# Patient Record
Sex: Male | Born: 1960 | Race: Black or African American | Hispanic: No | Marital: Single | Smoking: Current some day smoker
Health system: Southern US, Community
[De-identification: ages and names within clinical notes are randomized; demographics above are authoritative.]

## PROBLEM LIST (undated history)

## (undated) DIAGNOSIS — Z973 Presence of spectacles and contact lenses: Secondary | ICD-10-CM

## (undated) DIAGNOSIS — G5600 Carpal tunnel syndrome, unspecified upper limb: Secondary | ICD-10-CM

## (undated) DIAGNOSIS — T7840XA Allergy, unspecified, initial encounter: Secondary | ICD-10-CM

## (undated) DIAGNOSIS — K319 Disease of stomach and duodenum, unspecified: Secondary | ICD-10-CM

## (undated) DIAGNOSIS — G4730 Sleep apnea, unspecified: Secondary | ICD-10-CM

## (undated) DIAGNOSIS — D649 Anemia, unspecified: Secondary | ICD-10-CM

## (undated) DIAGNOSIS — I10 Essential (primary) hypertension: Secondary | ICD-10-CM

## (undated) DIAGNOSIS — E119 Type 2 diabetes mellitus without complications: Secondary | ICD-10-CM

## (undated) DIAGNOSIS — F1420 Cocaine dependence, uncomplicated: Secondary | ICD-10-CM

## (undated) DIAGNOSIS — K602 Anal fissure, unspecified: Secondary | ICD-10-CM

## (undated) DIAGNOSIS — M1990 Unspecified osteoarthritis, unspecified site: Secondary | ICD-10-CM

## (undated) DIAGNOSIS — G971 Other reaction to spinal and lumbar puncture: Secondary | ICD-10-CM

## (undated) DIAGNOSIS — F1020 Alcohol dependence, uncomplicated: Secondary | ICD-10-CM

## (undated) DIAGNOSIS — I25118 Atherosclerotic heart disease of native coronary artery with other forms of angina pectoris: Secondary | ICD-10-CM

## (undated) DIAGNOSIS — K635 Polyp of colon: Secondary | ICD-10-CM

## (undated) DIAGNOSIS — Z21 Asymptomatic human immunodeficiency virus [HIV] infection status: Secondary | ICD-10-CM

## (undated) DIAGNOSIS — G629 Polyneuropathy, unspecified: Secondary | ICD-10-CM

## (undated) DIAGNOSIS — F32A Depression, unspecified: Secondary | ICD-10-CM

## (undated) DIAGNOSIS — N19 Unspecified kidney failure: Secondary | ICD-10-CM

## (undated) DIAGNOSIS — J309 Allergic rhinitis, unspecified: Secondary | ICD-10-CM

## (undated) DIAGNOSIS — B20 Human immunodeficiency virus [HIV] disease: Secondary | ICD-10-CM

## (undated) DIAGNOSIS — F0390 Unspecified dementia without behavioral disturbance: Secondary | ICD-10-CM

## (undated) DIAGNOSIS — K219 Gastro-esophageal reflux disease without esophagitis: Secondary | ICD-10-CM

## (undated) DIAGNOSIS — F329 Major depressive disorder, single episode, unspecified: Secondary | ICD-10-CM

## (undated) DIAGNOSIS — F1220 Cannabis dependence, uncomplicated: Secondary | ICD-10-CM

## (undated) DIAGNOSIS — T4145XA Adverse effect of unspecified anesthetic, initial encounter: Secondary | ICD-10-CM

## (undated) DIAGNOSIS — N183 Chronic kidney disease, stage 3 unspecified: Secondary | ICD-10-CM

## (undated) DIAGNOSIS — I2510 Atherosclerotic heart disease of native coronary artery without angina pectoris: Secondary | ICD-10-CM

## (undated) DIAGNOSIS — F4321 Adjustment disorder with depressed mood: Secondary | ICD-10-CM

## (undated) DIAGNOSIS — I219 Acute myocardial infarction, unspecified: Secondary | ICD-10-CM

## (undated) DIAGNOSIS — T8859XA Other complications of anesthesia, initial encounter: Secondary | ICD-10-CM

## (undated) DIAGNOSIS — R569 Unspecified convulsions: Secondary | ICD-10-CM

## (undated) HISTORY — DX: Depression, unspecified: F32.A

## (undated) HISTORY — DX: Unspecified dementia, unspecified severity, without behavioral disturbance, psychotic disturbance, mood disturbance, and anxiety: F03.90

## (undated) HISTORY — DX: Alcohol dependence, uncomplicated: F10.20

## (undated) HISTORY — DX: Chronic kidney disease, stage 3 unspecified: N18.30

## (undated) HISTORY — DX: Unspecified osteoarthritis, unspecified site: M19.90

## (undated) HISTORY — DX: Type 2 diabetes mellitus without complications: E11.9

## (undated) HISTORY — DX: Polyp of colon: K63.5

## (undated) HISTORY — DX: Allergic rhinitis, unspecified: J30.9

## (undated) HISTORY — DX: Other complications of anesthesia, initial encounter: T88.59XA

## (undated) HISTORY — DX: Anal fissure, unspecified: K60.2

## (undated) HISTORY — DX: Asymptomatic human immunodeficiency virus (hiv) infection status: Z21

## (undated) HISTORY — PX: COLONOSCOPY: SHX174

## (undated) HISTORY — DX: Atherosclerotic heart disease of native coronary artery with other forms of angina pectoris: I25.118

## (undated) HISTORY — DX: Atherosclerotic heart disease of native coronary artery without angina pectoris: I25.10

## (undated) HISTORY — DX: Unspecified kidney failure: N19

## (undated) HISTORY — PX: CORONARY STENT PLACEMENT: SHX1402

## (undated) HISTORY — DX: Unspecified convulsions: R56.9

## (undated) HISTORY — PX: CARDIAC CATHETERIZATION: SHX172

## (undated) HISTORY — DX: Acute myocardial infarction, unspecified: I21.9

## (undated) HISTORY — PX: ESOPHAGOGASTRODUODENOSCOPY: SHX1529

## (undated) HISTORY — DX: Adjustment disorder with depressed mood: F43.21

## (undated) HISTORY — DX: Carpal tunnel syndrome, unspecified upper limb: G56.00

## (undated) HISTORY — DX: Sleep apnea, unspecified: G47.30

## (undated) HISTORY — DX: Gastro-esophageal reflux disease without esophagitis: K21.9

## (undated) HISTORY — DX: Cannabis dependence, uncomplicated: F12.20

## (undated) HISTORY — DX: Human immunodeficiency virus (HIV) disease: B20

## (undated) HISTORY — DX: Anemia, unspecified: D64.9

## (undated) HISTORY — DX: Essential (primary) hypertension: I10

## (undated) HISTORY — DX: Cocaine dependence, uncomplicated: F14.20

---
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
## 2010-01-01 ENCOUNTER — Inpatient Hospital Stay (HOSPITAL_COMMUNITY): Admission: AC | Admit: 2010-01-01 | Discharge: 2010-01-05 | Payer: Self-pay | Admitting: Emergency Medicine

## 2010-07-11 ENCOUNTER — Emergency Department (HOSPITAL_COMMUNITY)
Admission: EM | Admit: 2010-07-11 | Discharge: 2010-07-12 | Disposition: A | Discharge status: Home / Self Care | Attending: Emergency Medicine | Admitting: Emergency Medicine

## 2010-07-11 DIAGNOSIS — Z9861 Coronary angioplasty status: Secondary | ICD-10-CM | POA: Insufficient documentation

## 2010-07-11 DIAGNOSIS — N289 Disorder of kidney and ureter, unspecified: Secondary | ICD-10-CM | POA: Insufficient documentation

## 2010-07-11 DIAGNOSIS — R5383 Other fatigue: Secondary | ICD-10-CM | POA: Insufficient documentation

## 2010-07-11 DIAGNOSIS — G40909 Epilepsy, unspecified, not intractable, without status epilepticus: Secondary | ICD-10-CM | POA: Insufficient documentation

## 2010-07-11 DIAGNOSIS — E119 Type 2 diabetes mellitus without complications: Secondary | ICD-10-CM | POA: Insufficient documentation

## 2010-07-11 DIAGNOSIS — I2510 Atherosclerotic heart disease of native coronary artery without angina pectoris: Secondary | ICD-10-CM | POA: Insufficient documentation

## 2010-07-11 DIAGNOSIS — H538 Other visual disturbances: Secondary | ICD-10-CM | POA: Insufficient documentation

## 2010-07-11 DIAGNOSIS — Z79899 Other long term (current) drug therapy: Secondary | ICD-10-CM | POA: Insufficient documentation

## 2010-07-11 DIAGNOSIS — R5381 Other malaise: Secondary | ICD-10-CM | POA: Insufficient documentation

## 2010-07-11 DIAGNOSIS — I10 Essential (primary) hypertension: Secondary | ICD-10-CM | POA: Insufficient documentation

## 2010-07-11 DIAGNOSIS — R631 Polydipsia: Secondary | ICD-10-CM | POA: Insufficient documentation

## 2010-07-11 DIAGNOSIS — Z21 Asymptomatic human immunodeficiency virus [HIV] infection status: Secondary | ICD-10-CM | POA: Insufficient documentation

## 2010-07-11 LAB — GLUCOSE, CAPILLARY
Glucose-Capillary: 555 mg/dL (ref 70–99)
Glucose-Capillary: 344 mg/dL — ABNORMAL HIGH (ref 70–99)

## 2010-07-11 LAB — URINALYSIS, ROUTINE W REFLEX MICROSCOPIC
Glucose, UA: >1000 mg/dL — AB
Protein, ur: NEGATIVE mg/dL
pH: 6 (ref 5.0–8.0)

## 2010-07-11 LAB — POCT I-STAT, CHEM 8
Calcium, Ion: 1 mmol/L — ABNORMAL LOW (ref 1.12–1.32)
Chloride: 103 mEq/L (ref 96–112)
Glucose, Bld: 450 mg/dL — ABNORMAL HIGH (ref 70–99)
HCT: 39 % (ref 39.0–52.0)
TCO2: 15 mmol/L (ref 0–100)

## 2010-07-11 LAB — POCT I-STAT 3, VENOUS BLOOD GAS (G3P V)
Bicarbonate: 22.3 mEq/L (ref 20.0–24.0)
Patient temperature: 97.9
TCO2: 23 mmol/L (ref 0–100)
pH, Ven: 7.379 — ABNORMAL HIGH (ref 7.250–7.300)
pO2, Ven: 34 mmHg (ref 30.0–45.0)

## 2010-07-11 LAB — CBC
Hemoglobin: 13.2 g/dL (ref 13.0–17.0)
MCH: 32.8 pg (ref 26.0–34.0)
MCV: 88.8 fL (ref 78.0–100.0)
RBC: 4.03 MIL/uL — ABNORMAL LOW (ref 4.22–5.81)
WBC: 6.8 10*3/uL (ref 4.0–10.5)

## 2010-07-11 LAB — URINE MICROSCOPIC-ADD ON

## 2010-07-12 LAB — BASIC METABOLIC PANEL
BUN: 8 mg/dL (ref 6–23)
CO2: 23 mEq/L (ref 19–32)
Chloride: 108 mEq/L (ref 96–112)
Glucose, Bld: 77 mg/dL (ref 70–99)
Potassium: 3.1 mEq/L — ABNORMAL LOW (ref 3.5–5.1)
Sodium: 140 mEq/L (ref 135–145)

## 2010-07-13 ENCOUNTER — Observation Stay (HOSPITAL_COMMUNITY)
Admission: EM | Admit: 2010-07-13 | Discharge: 2010-07-14 | Disposition: A | Discharge status: Home / Self Care | Attending: Internal Medicine | Admitting: Internal Medicine

## 2010-07-13 DIAGNOSIS — I10 Essential (primary) hypertension: Secondary | ICD-10-CM | POA: Insufficient documentation

## 2010-07-13 DIAGNOSIS — F17200 Nicotine dependence, unspecified, uncomplicated: Secondary | ICD-10-CM | POA: Insufficient documentation

## 2010-07-13 DIAGNOSIS — G40802 Other epilepsy, not intractable, without status epilepticus: Secondary | ICD-10-CM | POA: Insufficient documentation

## 2010-07-13 DIAGNOSIS — R079 Chest pain, unspecified: Secondary | ICD-10-CM | POA: Insufficient documentation

## 2010-07-13 DIAGNOSIS — E119 Type 2 diabetes mellitus without complications: Principal | ICD-10-CM | POA: Insufficient documentation

## 2010-07-13 DIAGNOSIS — Z21 Asymptomatic human immunodeficiency virus [HIV] infection status: Secondary | ICD-10-CM | POA: Insufficient documentation

## 2010-07-13 DIAGNOSIS — IMO0001 Reserved for inherently not codable concepts without codable children: Secondary | ICD-10-CM | POA: Insufficient documentation

## 2010-07-13 DIAGNOSIS — F1210 Cannabis abuse, uncomplicated: Secondary | ICD-10-CM | POA: Insufficient documentation

## 2010-07-13 LAB — CBC
HCT: 35.3 % — ABNORMAL LOW (ref 39.0–52.0)
HCT: 33.2 % — ABNORMAL LOW (ref 39.0–52.0)
Hemoglobin: 12.8 g/dL — ABNORMAL LOW (ref 13.0–17.0)
Hemoglobin: 11.4 g/dL — ABNORMAL LOW (ref 13.0–17.0)
Hemoglobin: 12.1 g/dL — ABNORMAL LOW (ref 13.0–17.0)
MCH: 33.2 pg (ref 26.0–34.0)
MCH: 32.3 pg (ref 26.0–34.0)
MCH: 32.5 pg (ref 26.0–34.0)
MCHC: 36.3 g/dL — ABNORMAL HIGH (ref 30.0–36.0)
MCHC: 34.6 g/dL (ref 30.0–36.0)
MCV: 91.7 fL (ref 78.0–100.0)
MCV: 94.6 fL (ref 78.0–100.0)
Platelets: 217 10*3/uL (ref 150–400)
Platelets: 172 10*3/uL (ref 150–400)
Platelets: 180 10*3/uL (ref 150–400)
RBC: 3.85 MIL/uL — ABNORMAL LOW (ref 4.22–5.81)
RBC: 3.51 MIL/uL — ABNORMAL LOW (ref 4.22–5.81)
RBC: 3.71 MIL/uL — ABNORMAL LOW (ref 4.22–5.81)
RDW: 13.1 % (ref 11.5–15.5)
RDW: 13.7 % (ref 11.5–15.5)
WBC: 6.1 10*3/uL (ref 4.0–10.5)
WBC: 7 10*3/uL (ref 4.0–10.5)
WBC: 8.1 10*3/uL (ref 4.0–10.5)

## 2010-07-13 LAB — COMPREHENSIVE METABOLIC PANEL
AST: 57 U/L — ABNORMAL HIGH (ref 0–37)
CO2: 20 mEq/L (ref 19–32)
Calcium: 8.9 mg/dL (ref 8.4–10.5)
Creatinine, Ser: 1.75 mg/dL — ABNORMAL HIGH (ref 0.4–1.5)
GFR calc Af Amer: 51 mL/min — ABNORMAL LOW (ref >60)
GFR calc non Af Amer: 42 mL/min — ABNORMAL LOW (ref >60)
Glucose, Bld: 156 mg/dL — ABNORMAL HIGH (ref 70–99)

## 2010-07-13 LAB — URINALYSIS, ROUTINE W REFLEX MICROSCOPIC
Bilirubin Urine: NEGATIVE
Bilirubin Urine: NEGATIVE
Glucose, UA: >1000 mg/dL — AB
Glucose, UA: 250 mg/dL — AB
Hgb urine dipstick: NEGATIVE
Ketones, ur: 15 mg/dL — AB
Ketones, ur: NEGATIVE mg/dL
Leukocytes, UA: NEGATIVE
Leukocytes, UA: NEGATIVE
Nitrite: NEGATIVE
Protein, ur: NEGATIVE mg/dL
Specific Gravity, Urine: 1.038 — ABNORMAL HIGH (ref 1.005–1.030)
Urobilinogen, UA: 0.2 mg/dL (ref 0.0–1.0)
pH: 6 (ref 5.0–8.0)
pH: 6 (ref 5.0–8.0)

## 2010-07-13 LAB — POCT I-STAT, CHEM 8
BUN: 19 mg/dL (ref 6–23)
Calcium, Ion: 1.16 mmol/L (ref 1.12–1.32)
Chloride: 112 mEq/L (ref 96–112)
Creatinine, Ser: 1.9 mg/dL — ABNORMAL HIGH (ref 0.4–1.5)
Glucose, Bld: 151 mg/dL — ABNORMAL HIGH (ref 70–99)

## 2010-07-13 LAB — GLUCOSE, CAPILLARY
Glucose-Capillary: 100 mg/dL — ABNORMAL HIGH (ref 70–99)
Glucose-Capillary: 109 mg/dL — ABNORMAL HIGH (ref 70–99)
Glucose-Capillary: 122 mg/dL — ABNORMAL HIGH (ref 70–99)
Glucose-Capillary: 122 mg/dL — ABNORMAL HIGH (ref 70–99)
Glucose-Capillary: 124 mg/dL — ABNORMAL HIGH (ref 70–99)
Glucose-Capillary: 139 mg/dL — ABNORMAL HIGH (ref 70–99)
Glucose-Capillary: 157 mg/dL — ABNORMAL HIGH (ref 70–99)
Glucose-Capillary: 200 mg/dL — ABNORMAL HIGH (ref 70–99)
Glucose-Capillary: 89 mg/dL (ref 70–99)

## 2010-07-13 LAB — PROTIME-INR
INR: 1.02 (ref 0.00–1.49)
Prothrombin Time: 13.6 seconds (ref 11.6–15.2)

## 2010-07-13 LAB — TYPE AND SCREEN: ABO/RH(D): O POS

## 2010-07-13 LAB — BLOOD GAS, ARTERIAL
Acid-base deficit: 5.2 mmol/L — ABNORMAL HIGH (ref 0.0–2.0)
Bicarbonate: 19.2 mEq/L — ABNORMAL LOW (ref 20.0–24.0)
FIO2: 0.6 %
MECHVT: 600 mL
TCO2: 20.3 mmol/L (ref 0–100)
pCO2 arterial: 34.6 mmHg — ABNORMAL LOW (ref 35.0–45.0)

## 2010-07-13 LAB — DIFFERENTIAL
Basophils Absolute: 0 10*3/uL (ref 0.0–0.1)
Basophils Relative: 0 % (ref 0–1)
Eosinophils Absolute: 0.1 10*3/uL (ref 0.0–0.7)
Eosinophils Relative: 2 % (ref 0–5)
Lymphocytes Relative: 39 % (ref 12–46)
Lymphs Abs: 2.4 10*3/uL (ref 0.7–4.0)
Monocytes Absolute: 0.4 10*3/uL (ref 0.1–1.0)
Monocytes Relative: 6 % (ref 3–12)
Neutro Abs: 3.2 10*3/uL (ref 1.7–7.7)
Neutrophils Relative %: 53 % (ref 43–77)

## 2010-07-13 LAB — BASIC METABOLIC PANEL
BUN: 19 mg/dL (ref 6–23)
BUN: 7 mg/dL (ref 6–23)
BUN: 9 mg/dL (ref 6–23)
CO2: 23 mEq/L (ref 19–32)
CO2: 23 mEq/L (ref 19–32)
Calcium: 8.5 mg/dL (ref 8.4–10.5)
Calcium: 8.8 mg/dL (ref 8.4–10.5)
Chloride: 116 mEq/L — ABNORMAL HIGH (ref 96–112)
Chloride: 98 mEq/L (ref 96–112)
Creatinine, Ser: 1.39 mg/dL (ref 0.4–1.5)
Creatinine, Ser: 1.5 mg/dL (ref 0.4–1.5)
Creatinine, Ser: 1.89 mg/dL — ABNORMAL HIGH (ref 0.4–1.5)
GFR calc Af Amer: 46 mL/min — ABNORMAL LOW (ref >60)
GFR calc Af Amer: 60 mL/min — ABNORMAL LOW (ref >60)
GFR calc Af Amer: >60 mL/min (ref >60)
GFR calc Af Amer: >60 mL/min (ref >60)
GFR calc non Af Amer: 38 mL/min — ABNORMAL LOW (ref >60)
GFR calc non Af Amer: 50 mL/min — ABNORMAL LOW (ref >60)
GFR calc non Af Amer: 55 mL/min — ABNORMAL LOW (ref >60)
Glucose, Bld: 591 mg/dL (ref 70–99)
Potassium: 3 mEq/L — ABNORMAL LOW (ref 3.5–5.1)
Potassium: 3.3 mEq/L — ABNORMAL LOW (ref 3.5–5.1)
Potassium: 4.9 mEq/L (ref 3.5–5.1)
Sodium: 128 mEq/L — ABNORMAL LOW (ref 135–145)
Sodium: 139 mEq/L (ref 135–145)

## 2010-07-13 LAB — POCT I-STAT 3, VENOUS BLOOD GAS (G3P V)
Acid-base deficit: 1 mmol/L (ref 0.0–2.0)
Bicarbonate: 23.7 mEq/L (ref 20.0–24.0)
O2 Saturation: 59 %
pCO2, Ven: 39 mmHg — ABNORMAL LOW (ref 45.0–50.0)
pO2, Ven: 31 mmHg (ref 30.0–45.0)

## 2010-07-13 LAB — URINE MICROSCOPIC-ADD ON

## 2010-07-13 LAB — APTT: aPTT: 20 seconds — ABNORMAL LOW (ref 24–37)

## 2010-07-13 LAB — MRSA PCR SCREENING: MRSA by PCR: NEGATIVE

## 2010-07-14 LAB — CBC
MCH: 32.5 pg (ref 26.0–34.0)
MCV: 90.2 fL (ref 78.0–100.0)
Platelets: 229 10*3/uL (ref 150–400)
RDW: 12.9 % (ref 11.5–15.5)
WBC: 6.2 10*3/uL (ref 4.0–10.5)

## 2010-07-14 LAB — GLUCOSE, CAPILLARY
Glucose-Capillary: 117 mg/dL — ABNORMAL HIGH (ref 70–99)
Glucose-Capillary: 99 mg/dL (ref 70–99)

## 2010-07-14 LAB — BASIC METABOLIC PANEL
BUN: 13 mg/dL (ref 6–23)
Calcium: 8.5 mg/dL (ref 8.4–10.5)
Creatinine, Ser: 1.3 mg/dL (ref 0.4–1.5)
GFR calc non Af Amer: 59 mL/min — ABNORMAL LOW (ref >60)
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
## 2010-07-27 NOTE — Discharge Summary (Signed)
  NAMEPANFILO, Harold Davenport NO.:  [id number]  MEDICAL RECORD NO.:  [id number]           PATIENT TYPE:  O  LOCATION:  5505                         FACILITY:  [facility]  PHYSICIAN:  Zannie Cove, MD     DATE OF BIRTH:  [birth year]  DATE OF ADMISSION:  07/13/2010 DATE OF DISCHARGE:  07/14/2010                              DISCHARGE SUMMARY   PRIMARY CARE PHYSICIAN:  [place], [place].  DISCHARGE DIAGNOSES: 1. Uncontrolled hyperglycemia. 2. HIV on highly active antiretroviral therapy. 3. Seizure disorder. 4. Tobacco use. 5. Mild dehydration. 6. Reported history of coronary artery disease and chronic kidney     disease of unclear severity and type.  DISCHARGE MEDICATIONS: 1. Lantus 20 units subcu at bedtime. 2. abacavir lamivudine 600/300 one tab daily. 3. Amlodipine 10 mg daily. 4. Atazanavir 300 mg daily. 5. Cetirizine 10 mg daily. 6. Chlorhexidine oral rinse 15 mL p.o. q.h.s. 7. Clonidine 0.1 mg p.o. daily. 8. Diphenhydramine 25 mg p.o. daily p.r.n. 9. Glipizide 10 mg p.o. b.i.d. 10.Hydroxyzine pamoate 25 mg p.o. b.i.d. p.r.n. 11.Imdur 30 mg p.o. daily. 12.Lisinopril 40 mg daily. 13.Keppra 750 mg p.o. b.i.d. 14.Paroxetine 40 mg 1/2 tab p.o. bedtime. 15.Ritonavir 100 mg capsule daily. 16.Trazodone 150 mg 2 tablets p.o. q.h.s. p.r.n.  HOSPITAL COURSE:  Mr. Sivils is a 50 year old gentleman with type 2 diabetes for 19 years, presented to the ER originally on July 11, 2010, with very high blood sugars running in the 450s.  He was placed on hypoglycemia protocol, the CT, and then discharged home.  Subsequently, he went home on glipizide and tried to increase his dose, but then kept having persistent high sugars in the 500 and 600 range and hence came back to the hospital, originally started here. 1. For his uncontrolled hyperglycemia, he did not have any evidence of     DKA and his mentation remained appropriate and normal.  He was     started on a glucose  stabilizer initially, subsequently     transitioned to subcu Lantus.  He also had diabetic education in     the hospital and is being discharged home on Lantus to follow up at     the [place] in 3 days.  He has an appointment on Monday. 2. HIV.  He is continued on HAART therapy through his hospital course. 3. Seizure disorder, continued on Keppra.  DISCHARGE CONDITION:  Stable.  CONDITION ON DISCHARGE:  Temp is 98.1, pulse 76, blood pressure 107/71, respirations 18, satting 98% on room air.  DISCHARGE FOLLOWUP:  With primary physician at [place], [place] on Monday July 17, 2010.     Zannie Cove, MD     PJ/MEDQ  D:  07/14/2010  T:  07/15/2010  Job:  [id number]  cc:   Vilinda [place], [place]  Electronically Signed by Zannie Cove  on 07/27/2010 05:27:07 PM

## 2010-09-24 ENCOUNTER — Emergency Department (HOSPITAL_COMMUNITY)
Admission: EM | Admit: 2010-09-24 | Discharge: 2010-09-25 | Disposition: A | Discharge status: Home / Self Care | Attending: Emergency Medicine | Admitting: Emergency Medicine

## 2010-09-24 DIAGNOSIS — Z21 Asymptomatic human immunodeficiency virus [HIV] infection status: Secondary | ICD-10-CM | POA: Insufficient documentation

## 2010-09-24 DIAGNOSIS — I2510 Atherosclerotic heart disease of native coronary artery without angina pectoris: Secondary | ICD-10-CM | POA: Insufficient documentation

## 2010-09-24 DIAGNOSIS — Z9861 Coronary angioplasty status: Secondary | ICD-10-CM | POA: Insufficient documentation

## 2010-09-24 DIAGNOSIS — F411 Generalized anxiety disorder: Secondary | ICD-10-CM | POA: Insufficient documentation

## 2010-09-24 DIAGNOSIS — L299 Pruritus, unspecified: Secondary | ICD-10-CM | POA: Insufficient documentation

## 2010-09-24 DIAGNOSIS — J309 Allergic rhinitis, unspecified: Secondary | ICD-10-CM | POA: Insufficient documentation

## 2010-09-24 DIAGNOSIS — J3489 Other specified disorders of nose and nasal sinuses: Secondary | ICD-10-CM | POA: Insufficient documentation

## 2010-09-24 DIAGNOSIS — E119 Type 2 diabetes mellitus without complications: Secondary | ICD-10-CM | POA: Insufficient documentation

## 2010-09-24 DIAGNOSIS — I10 Essential (primary) hypertension: Secondary | ICD-10-CM | POA: Insufficient documentation

## 2010-09-24 DIAGNOSIS — G40909 Epilepsy, unspecified, not intractable, without status epilepticus: Secondary | ICD-10-CM | POA: Insufficient documentation

## 2018-10-24 ENCOUNTER — Telehealth: Payer: Self-pay

## 2018-10-24 ENCOUNTER — Ambulatory Visit (INDEPENDENT_AMBULATORY_CARE_PROVIDER_SITE_OTHER): Payer: No Typology Code available for payment source | Admitting: Nurse Practitioner

## 2018-10-24 ENCOUNTER — Encounter: Payer: Self-pay | Admitting: Nurse Practitioner

## 2018-10-24 VITALS — BP 146/104 | HR 89 | Temp 97.2°F | Ht 69.75 in | Wt 210.5 lb

## 2018-10-24 DIAGNOSIS — K5900 Constipation, unspecified: Secondary | ICD-10-CM

## 2018-10-24 DIAGNOSIS — K219 Gastro-esophageal reflux disease without esophagitis: Secondary | ICD-10-CM

## 2018-10-24 NOTE — Progress Notes (Signed)
ASSESSMENT / PLAN:    58 yo male with CAD, CKD3, HIV, and depression followed by GI at [facility] for hx of GERD and constipation.  [facility] due to intolerance to conscious sedation. Plan per the office records that I reviewed was to repeat both procedures with MAC. The [facility] has [facility] for these procedures (? MAC not available at [facility]?  Cannot do procedures right now due to [place] pandemic?).  -Over the telephone we had previously requested that patient call [facility] to get EGD / colonoscopy reports for our review but I do not see them in the packet of records I reviewed.  -I asked patient to please get the EGD / colonoscopy reports so that we can review them. Following that we can proceed with scheduling requested procedures I will discuss with Dr. Rush Landmark, if he is okay proceeding with procedures without records then will go ahead and schedule.    HPI:    Chief Complaint:   Needs EGD and colonoscopy  Patient is a 58 year old male with multiple medical problems not limited to polysubstance abuse, depression, HTN, HIV, CKD 3, DM, CAD, arthritis, and seizures.  He gets his medical care and GI care at the [place] in [place].   GI history pertinent for colon polyps, anemia and GERD.  They has referred patient to [place] for EGD and colonoscopy with MAC sedation.  We apparently requested the EGD and colonoscopy reports at some point in time.  Patient says he asked the [facility] to send those.  I do not have those reports in the large packet of records I have for review today  According to excerpt from an office note patient had EGD and colonoscopy 06/25/2018.  He didn't tolerate conscious sedation, procedures aborted.  Based on office notes the EGD was done for evaluation of longstanding GERD.  A mildly irregular Z line was seen, esophagus otherwise normal. There was solid stool in the colon.  Hyperplastic polyp was removed from the sigmoid.    Patient had a recent telehealth visit with the [facility] at which time he was complaining of constipation and heartburn.  Plan was for repeating the EGD as well as colonoscopy with MAC sedation.  Now patient has been referred to [place] for those procedures.   Patient has no GI complaints at present.  After dietary changes he is actually off Prilosec now.  If a certain food causes heartburn he eliminates it from his diet.  Bowels are moving well, no rectal bleeding.  He was constipated during COVID, used Miralax but now more mobile and constipation has resolved.    Data Reviewed:  January 2020 WBC 5.7, hemoglobin 13.9, MCV 94, platelet 270 BUN 21, creatinine 1.8   Past Medical History:  Diagnosis Date  . Adjustment disorder with depressed mood   . Alcohol dependence ([place])   . Allergic rhinitis   . Anal fissure   . Anemia   . Arthritis   . Athscl heart disease of native cor art w oth ang pctrs ([place])   . CAD (coronary artery disease)   . Cannabis dependence ([place])   . Carpal tunnel syndrome   . Chronic kidney disease, stage 3, mod decreased GFR ([facility])   . Cocaine dependence ([place])   . Colon polyps   . Dementia ([place])   . Depression   . Diabetes ([place])   .  GERD (gastroesophageal reflux disease)   . Heart attack ([place])    X 4  . HIV (human immunodeficiency virus infection) ([place])   . Hypertension   . Renal failure   . Seizure ([place])   . Sleep apnea    have a cpap but use it when he is "Heavy"     Past Surgical History:  Procedure Laterality Date  . COLONOSCOPY     [facility]. First one done at 54. Recently had one done (2019 or 2020) and wasn't completed  . CORONARY STENT PLACEMENT    . ESOPHAGOGASTRODUODENOSCOPY     [facility]. Thinks it was done 2020 ut incomplete   Family History  Problem Relation Age of Onset  . Diabetes Mother   . Diabetes Father   . Diabetes Sister        has 4 sisters  . Diabetes Brother   . Diabetes Brother   . Diabetes Brother   . Colon cancer Neg Hx    . Esophageal cancer Neg Hx    Social History   Tobacco Use  . Smoking status: Current Every Day Smoker  . Smokeless tobacco: Never Used  Substance Use Topics  . Alcohol use: Yes    Comment: ocassionally  . Drug use: Yes    Types: Marijuana   Current Outpatient Medications  Medication Sig Dispense Refill  . amLODipine (NORVASC) 10 MG tablet Take 10 mg by mouth daily.    Marland Kitchen aspirin EC 81 MG tablet Take 81 mg by mouth daily.    . Bictegravir-Emtricitab-Tenofov (BIKTARVY PO) Take 1 tablet by mouth daily.    . cromolyn (NASALCROM) 5.2 MG/ACT nasal spray Place 1 spray into both nostrils 3 (three) times daily.    . diclofenac sodium (VOLTAREN) 1 % GEL Apply 2 g topically 2 (two) times a day.    . diphenhydrAMINE (BENADRYL) 25 mg capsule Take 25 mg by mouth 2 (two) times a day.    . gabapentin (NEURONTIN) 300 MG capsule Take 300 mg by mouth 2 (two) times daily.    . insulin aspart (NOVOLOG) 100 unit/mL injection Inject into the skin 3 (three) times daily before meals. On sliding scale. If blood surgar is over 150 patient will take    . INSULIN GLARGINE Qulin Inject 40 Units into the skin at bedtime.     . isosorbide mononitrate (IMDUR) 60 MG 24 hr tablet Take 60 mg by mouth daily.    Marland Kitchen lisinopril (ZESTRIL) 20 MG tablet Take 20 mg by mouth daily.    . mirtazapine (REMERON) 15 MG tablet Take 15 mg by mouth at bedtime.    Marland Kitchen omeprazole (PRILOSEC) 20 MG capsule Take 20 mg by mouth daily.    . pravastatin (PRAVACHOL) 20 MG tablet Take 20 mg by mouth daily.    Marland Kitchen REFRESH 1.4-0.6 % SOLN Place 1 drop into both eyes as needed.    . tamsulosin (FLOMAX) 0.4 MG CAPS capsule Take 0.4 mg by mouth at bedtime.    . traZODone (DESYREL) 50 MG tablet Take 50 mg by mouth at bedtime.    Marland Kitchen venlafaxine XR (EFFEXOR-XR) 37.5 MG 24 hr capsule Take 37.5 mg by mouth daily with breakfast.    . polyethylene glycol (MIRALAX / GLYCOLAX) 17 g packet Take 17 g by mouth daily.     No current facility-administered medications for  this visit.    Allergies  Allergen Reactions  . Ampicillin     Rash  . Compazine [Prochlorperazine Edisylate]  Twisted Face  . Crixivan [Indinavir]     Swelling  . Scallops [Shellfish Allergy]     Swelling  . Sustiva [Efavirenz]     Water Retention     Review of Systems: All systems reviewed and negative except where noted in HPI.   Creatinine clearance cannot be calculated (Patient's most recent lab result is older than the maximum 21 days allowed.)   Physical Exam:    Wt Readings from Last 3 Encounters:  10/24/18 210 lb 8 oz (95.5 kg)    BP (!) 146/104   Pulse 89   Temp (!) 97.2 F (36.2 C) (Temporal)   Ht 5' 9.75" (1.772 m)   Wt 210 lb 8 oz (95.5 kg)   BMI 30.42 kg/m  Constitutional:  Pleasant male in no acute distress. Psychiatric: Normal mood and affect. Behavior is normal. EENT: Pupils normal.  Conjunctivae are normal. No scleral icterus. Neck supple.  Cardiovascular: Normal rate, regular rhythm. No edema Pulmonary/chest: Effort normal and breath sounds normal. No wheezing, rales or rhonchi. Abdominal: Soft, nondistended, nontender. Bowel sounds active throughout. There are no masses palpable. No hepatomegaly. Neurological: Alert and oriented to person place and time. Skin: Skin is warm and dry. No rashes noted.  Tye Savoy, NP  10/24/2018, 1:40 PM  [facility] Department of [place], [place] No ref. provider found

## 2018-10-24 NOTE — Telephone Encounter (Signed)
Covid-19 screening questions   Do you now or have you had a fever in the last 14 days? No  Do you have any respiratory symptoms of shortness of breath or cough now or in the last 14 days? Yes said he has allergies  Do you have any family members or close contacts with diagnosed or suspected Covid-19 in the past 14 days? No  Have you been tested for Covid-19 and found to be positive? No

## 2018-10-24 NOTE — Patient Instructions (Signed)
We will continue to attempt to get remaining records from the [place]. Please continue to call them as well to ask that these be sent.  We will be in contact with you regarding whether we can go forward with endoscopy/colonoscopy at this time.  If you are age 58 or older, your body mass index should be between 23-30. Your Body mass index is 30.42 kg/m. If this is out of the aforementioned range listed, please consider follow up with your Primary Care Provider.  If you are age 67 or younger, your body mass index should be between 19-25. Your Body mass index is 30.42 kg/m. If this is out of the aformentioned range listed, please consider follow up with your Primary Care Provider.

## 2018-10-27 NOTE — Progress Notes (Signed)
Attending Physician's Attestation   I have reviewed the chart.   I agree with the Advanced Practitioner's note, impression, and recommendations.   I have not been able to view the records, but if the [facility] felt that the patient had an indication to do both an EGD and Colonoscopy repeated then we can proceed in this fashion. He does need to have the records of the actual reports sent to [place] ASAP. Ideally, our staff gets this so that we can get him scheduled ASAP.  Justice Britain, MD [facility] Advanced Endoscopy Office # [phone]

## 2018-11-25 ENCOUNTER — Emergency Department (HOSPITAL_COMMUNITY)
Admission: EM | Admit: 2018-11-25 | Discharge: 2018-11-25 | Disposition: A | Discharge status: Home / Self Care | Payer: No Typology Code available for payment source | Attending: Emergency Medicine | Admitting: Emergency Medicine

## 2018-11-25 ENCOUNTER — Other Ambulatory Visit: Payer: Self-pay

## 2018-11-25 ENCOUNTER — Emergency Department (HOSPITAL_COMMUNITY): Payer: No Typology Code available for payment source

## 2018-11-25 ENCOUNTER — Encounter (HOSPITAL_COMMUNITY): Payer: Self-pay

## 2018-11-25 DIAGNOSIS — E1122 Type 2 diabetes mellitus with diabetic chronic kidney disease: Secondary | ICD-10-CM | POA: Insufficient documentation

## 2018-11-25 DIAGNOSIS — Z79899 Other long term (current) drug therapy: Secondary | ICD-10-CM | POA: Insufficient documentation

## 2018-11-25 DIAGNOSIS — F1290 Cannabis use, unspecified, uncomplicated: Secondary | ICD-10-CM | POA: Insufficient documentation

## 2018-11-25 DIAGNOSIS — Z794 Long term (current) use of insulin: Secondary | ICD-10-CM | POA: Insufficient documentation

## 2018-11-25 DIAGNOSIS — I2510 Atherosclerotic heart disease of native coronary artery without angina pectoris: Secondary | ICD-10-CM | POA: Insufficient documentation

## 2018-11-25 DIAGNOSIS — N183 Chronic kidney disease, stage 3 unspecified: Secondary | ICD-10-CM

## 2018-11-25 DIAGNOSIS — F17210 Nicotine dependence, cigarettes, uncomplicated: Secondary | ICD-10-CM | POA: Insufficient documentation

## 2018-11-25 DIAGNOSIS — R519 Headache, unspecified: Secondary | ICD-10-CM

## 2018-11-25 DIAGNOSIS — I129 Hypertensive chronic kidney disease with stage 1 through stage 4 chronic kidney disease, or unspecified chronic kidney disease: Secondary | ICD-10-CM | POA: Insufficient documentation

## 2018-11-25 DIAGNOSIS — B20 Human immunodeficiency virus [HIV] disease: Secondary | ICD-10-CM | POA: Insufficient documentation

## 2018-11-25 DIAGNOSIS — I10 Essential (primary) hypertension: Secondary | ICD-10-CM

## 2018-11-25 DIAGNOSIS — Z7982 Long term (current) use of aspirin: Secondary | ICD-10-CM | POA: Insufficient documentation

## 2018-11-25 DIAGNOSIS — R51 Headache: Secondary | ICD-10-CM | POA: Diagnosis present

## 2018-11-25 DIAGNOSIS — I252 Old myocardial infarction: Secondary | ICD-10-CM | POA: Diagnosis not present

## 2018-11-25 LAB — COMPREHENSIVE METABOLIC PANEL
ALT: 20 U/L (ref 0–44)
AST: 22 U/L (ref 15–41)
Albumin: 3.9 g/dL (ref 3.5–5.0)
Alkaline Phosphatase: 57 U/L (ref 38–126)
Anion gap: 9 (ref 5–15)
BUN: 17 mg/dL (ref 6–20)
CO2: 24 mmol/L (ref 22–32)
Calcium: 9.2 mg/dL (ref 8.9–10.3)
Chloride: 108 mmol/L (ref 98–111)
Creatinine, Ser: 1.74 mg/dL — ABNORMAL HIGH (ref 0.61–1.24)
GFR calc Af Amer: 49 mL/min — ABNORMAL LOW (ref >60)
GFR calc non Af Amer: 43 mL/min — ABNORMAL LOW (ref >60)
Glucose, Bld: 108 mg/dL — ABNORMAL HIGH (ref 70–99)
Potassium: 3.6 mmol/L (ref 3.5–5.1)
Sodium: 141 mmol/L (ref 135–145)
Total Bilirubin: 0.5 mg/dL (ref 0.3–1.2)
Total Protein: 6.8 g/dL (ref 6.5–8.1)

## 2018-11-25 LAB — RAPID URINE DRUG SCREEN, HOSP PERFORMED
Amphetamines: NOT DETECTED
Barbiturates: NOT DETECTED
Benzodiazepines: NOT DETECTED
Cocaine: NOT DETECTED
Opiates: NOT DETECTED
Tetrahydrocannabinol: POSITIVE — AB

## 2018-11-25 LAB — CBC
HCT: 40 % (ref 39.0–52.0)
Hemoglobin: 13.6 g/dL (ref 13.0–17.0)
MCH: 32.4 pg (ref 26.0–34.0)
MCHC: 34 g/dL (ref 30.0–36.0)
MCV: 95.2 fL (ref 80.0–100.0)
Platelets: 279 10*3/uL (ref 150–400)
RBC: 4.2 MIL/uL — ABNORMAL LOW (ref 4.22–5.81)
RDW: 13.1 % (ref 11.5–15.5)
WBC: 5.8 10*3/uL (ref 4.0–10.5)
nRBC: 0 % (ref 0.0–0.2)

## 2018-11-25 MED ORDER — MORPHINE SULFATE (PF) 4 MG/ML IV SOLN
4.0000 mg | Freq: Once | INTRAVENOUS | Status: AC
  Administered 2018-11-25: 13:00:00 4 mg via INTRAVENOUS
  Filled 2018-11-25: qty 1

## 2018-11-25 MED ORDER — LABETALOL HCL 5 MG/ML IV SOLN
20.0000 mg | Freq: Once | INTRAVENOUS | Status: AC
  Administered 2018-11-25: 20 mg via INTRAVENOUS
  Filled 2018-11-25: qty 4

## 2018-11-25 MED ORDER — METOCLOPRAMIDE HCL 5 MG/ML IJ SOLN
10.0000 mg | Freq: Once | INTRAMUSCULAR | Status: AC
  Administered 2018-11-25: 10 mg via INTRAVENOUS
  Filled 2018-11-25: qty 2

## 2018-11-25 NOTE — ED Notes (Signed)
Pt taken to decon room for shower, all clothing and linens double bagged.

## 2018-11-25 NOTE — Discharge Instructions (Addendum)
It was our pleasure to provide your ER care today - we hope that you feel better.  Today, your blood pressure is high - continue your blood pressure medication, limit salt intake, and follow up with primary care doctor in the next 2-3 days for recheck.   From today's labs (and not previously) your kidney function test is elevated (creatinine 1.74) - avoid taking anti-inflammatory type pain medication such as ibuprofen or naprosyn, and follow up with your primary care doctor.   Return to ER if worse, new or severe pain, trouble breathing, chest pain, other concern.

## 2018-11-25 NOTE — ED Notes (Signed)
Pt ambulated to the bathroom independently.

## 2018-11-25 NOTE — ED Notes (Signed)
Called CT to let them know pt has been deconned and ready for CT

## 2018-11-25 NOTE — ED Notes (Signed)
Pt discharged with all belongings. Discharge instructions reviewed with pt, and pt verbalized understanding. Opportunity for questions provided.  

## 2018-11-25 NOTE — ED Notes (Addendum)
Called CT again to ask why pt has not been picked up. They say he is next.

## 2018-11-25 NOTE — ED Triage Notes (Signed)
PTAR reports pt woke with headache and high bp, Called doc and was told to come to ED, recently in hosp and should be on HCTZ. HCTZ is in the mail. BP 170/110 cbg 124 97% RA hr 84 Has insulin patch on R arm

## 2018-11-25 NOTE — ED Notes (Signed)
Called NS and Charge RN to let them know about bed bugs for planning purposes

## 2018-11-25 NOTE — ED Provider Notes (Addendum)
[facility] EMERGENCY DEPARTMENT Provider Note   CSN: [id number] Arrival date & time: 11/25/18  1118     History   Chief Complaint Chief Complaint  Patient presents with  . Headache  . Hypertension    HPI Harold Davenport is a 58 y.o. male.     Patient c/o frontal headache acute onset today, a couple hours ago, at rest. Pain dull, mod-sev, non radiating. States hx similar headache in past, but this is more persistent. Also is concerned that bp has stayed up - notes recent admission to [facility] w same. States is on 3 bp meds, took a dose this AM. States he also is supposed to be on hctz but that hasnt arrived from pharmacy. Denies chest pain or sob. No cough or known covid+ exposure. No fever or chills. Denies abd pain or nvd. No extremity edema. No recent head trauma, fall, or syncope. No sinus drainage or congestion. No change in speech or vision. No numbness/weakness, or loss of normal functional ability.  No problems w gait/balance.   The history is provided by the patient.  Headache Associated symptoms: no abdominal pain, no back pain, no cough, no fever, no nausea, no neck pain, no numbness, no sore throat, no vomiting and no weakness   Hypertension Associated symptoms include headaches. Pertinent negatives include no chest pain, no abdominal pain and no shortness of breath.    Past Medical History:  Diagnosis Date  . Adjustment disorder with depressed mood   . Alcohol dependence ([place])   . Allergic rhinitis   . Anal fissure   . Anemia   . Arthritis   . 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 CAD (coronary artery disease)   . Cannabis dependence ([place])   . Carpal tunnel syndrome   . Chronic kidney disease, stage 3, mod decreased GFR (HCC)   . Cocaine dependence ([place])   . Colon polyps   . Dementia ([place])   . Depression   . Diabetes ([place])   . GERD (gastroesophageal reflux disease)   . Heart attack ([place])    X 4  . HIV (human  immunodeficiency virus infection) ([place])   . Hypertension   . Renal failure   . Seizure ([place])   . Sleep apnea    have a cpap but use it when he is "Heavy"    There are no active problems to display for this patient.   Past Surgical History:  Procedure Laterality Date  . COLONOSCOPY     [place], [place] at the [place]. First one done at 69. Recently had one done (2019 or 2020) and wasn't completed  . CORONARY STENT [place], [place] at the [place]. Thinks it was done 2020 it was incomplete        Home Medications    Prior to Admission medications   Medication Sig Start Date End Date Taking? Authorizing Provider  amLODipine (NORVASC) 10 MG tablet Take 10 mg by mouth daily.    [provider]  aspirin EC 81 MG tablet Take 81 mg by mouth daily.    [provider]  Bictegravir-Emtricitab-Tenofov (BIKTARVY PO) Take 1 tablet by mouth daily.    [provider]  cromolyn (NASALCROM) 5.2 MG/ACT nasal spray Place 1 spray into both nostrils 3 (three) times daily.    [provider]  diclofenac sodium (VOLTAREN) 1 % GEL Apply 2 g topically 2 (two)  times a day.    [provider]  diphenhydrAMINE (BENADRYL) 25 mg capsule Take 25 mg by mouth 2 (two) times a day.    [provider]  gabapentin (NEURONTIN) 300 MG capsule Take 300 mg by mouth 2 (two) times daily.    [provider]  insulin aspart (NOVOLOG) 100 unit/mL injection Inject into the skin 3 (three) times daily before meals. On sliding scale. If blood surgar is over 150 patient will take    [provider]  INSULIN GLARGINE [place] Inject 40 Units into the skin at bedtime.     [provider]  isosorbide mononitrate (IMDUR) 60 MG 24 hr tablet Take 60 mg by mouth daily.    [provider]  lisinopril (ZESTRIL) 20 MG tablet Take 20 mg by mouth daily.    [provider]  mirtazapine (REMERON) 15 MG tablet Take 15 mg by  mouth at bedtime.    [provider]  omeprazole (PRILOSEC) 20 MG capsule Take 20 mg by mouth daily.    [provider]  polyethylene glycol (MIRALAX / GLYCOLAX) 17 g packet Take 17 g by mouth daily.    [provider]  pravastatin (PRAVACHOL) 20 MG tablet Take 20 mg by mouth daily.    [provider]  REFRESH 1.4-0.6 % SOLN Place 1 drop into both eyes as needed. 07/11/18   [provider]  tamsulosin (FLOMAX) 0.4 MG CAPS capsule Take 0.4 mg by mouth at bedtime.    [provider]  traZODone (DESYREL) 50 MG tablet Take 50 mg by mouth at bedtime.    [provider]  venlafaxine XR (EFFEXOR-XR) 37.5 MG 24 hr capsule Take 37.5 mg by mouth daily with breakfast.    [provider]    Family History Family History  Problem Relation Age of Onset  . Diabetes Mother   . Diabetes Father   . Diabetes Sister        has 4 sisters  . Diabetes Brother   . Diabetes Brother   . Diabetes Brother   . Colon cancer Neg Hx   . Esophageal cancer Neg Hx     Social History Social History   Tobacco Use  . Smoking status: Current Every Day Smoker  . Smokeless tobacco: Never Used  Substance Use Topics  . Alcohol use: Yes    Comment: ocassionally  . Drug use: Yes    Types: Marijuana     Allergies   Ampicillin, Compazine [prochlorperazine edisylate], Crixivan [indinavir], Scallops [shellfish allergy], and Sustiva [efavirenz]   Review of Systems Review of Systems  Constitutional: Negative for chills and fever.  HENT: Negative for sore throat.   Eyes: Negative for redness.  Respiratory: Negative for cough and shortness of breath.   Cardiovascular: Negative for chest pain and leg swelling.  Gastrointestinal: Negative for abdominal pain, nausea and vomiting.  Endocrine: Negative for polyuria.  Genitourinary: Negative for dysuria and flank pain.  Musculoskeletal: Negative for back pain and neck pain.  Skin: Negative for rash.   Neurological: Positive for headaches. Negative for speech difficulty, weakness and numbness.  Hematological: Does not bruise/bleed easily.  Psychiatric/Behavioral: Negative for confusion.     Physical Exam Updated Vital Signs BP (!) 180/120 (BP Location: Left Arm)   Pulse 70   Temp 97.9 F (36.6 C) (Oral)   Resp 18   Ht 1.753 m (5\' 9" )   Wt 95.3 kg   SpO2 99%   BMI 31.01 kg/m   Physical Exam  Vitals signs and nursing note reviewed.  Constitutional:      Appearance: Normal appearance. He is well-developed.  HENT:     Head: Atraumatic.     Comments: No sinus or temporal tenderness.     Nose: Nose normal.     Mouth/Throat:     Mouth: Mucous membranes are moist.     Pharynx: Oropharynx is clear.  Eyes:     General: No scleral icterus.    Extraocular Movements: Extraocular movements intact.     Conjunctiva/sclera: Conjunctivae normal.     Pupils: Pupils are equal, round, and reactive to light.  Neck:     Musculoskeletal: Normal range of motion and neck supple. No neck rigidity.     Vascular: No carotid bruit.     Trachea: No tracheal deviation.     Comments: Thyroid not enlarged or tender.  Cardiovascular:     Rate and Rhythm: Normal rate and regular rhythm.     Pulses: Normal pulses.     Heart sounds: Normal heart sounds. No murmur. No friction rub. No gallop.   Pulmonary:     Effort: Pulmonary effort is normal. No accessory muscle usage or respiratory distress.     Breath sounds: Normal breath sounds.  Chest:     Chest wall: No tenderness.  Abdominal:     General: Bowel sounds are normal. There is no distension.     Palpations: Abdomen is soft. There is no mass.     Tenderness: There is no abdominal tenderness. There is no guarding.     Comments: No bruits.   Genitourinary:    Comments: No cva tenderness. Musculoskeletal:        General: No swelling or tenderness.     Right lower leg: No edema.     Left lower leg: No edema.  Skin:    General: Skin is warm and  dry.     Findings: No rash.  Neurological:     General: No focal deficit present.     Mental Status: He is alert and oriented to person, place, and time.     Cranial Nerves: No cranial nerve deficit.     Comments: Alert, speech clear/fluent. Motor intact bil, stre 5/5. sens grossly intact. Steady gait.   Psychiatric:        Mood and Affect: Mood normal.      ED Treatments / Results  Labs (all labs ordered are listed, but only abnormal results are displayed) Results for orders placed or performed during the hospital encounter of 11/25/18  CBC  Result Value Ref Range   WBC 5.8 4.0 - 10.5 K/uL   RBC 4.20 (L) 4.22 - 5.81 MIL/uL   Hemoglobin 13.6 13.0 - 17.0 g/dL   HCT 40.0 39.0 - 52.0 %   MCV 95.2 80.0 - 100.0 fL   MCH 32.4 26.0 - 34.0 pg   MCHC 34.0 30.0 - 36.0 g/dL   RDW 13.1 11.5 - 15.5 %   Platelets 279 150 - 400 K/uL   nRBC 0.0 0.0 - 0.2 %  Comprehensive metabolic panel  Result Value Ref Range   Sodium 141 135 - 145 mmol/L   Potassium 3.6 3.5 - 5.1 mmol/L   Chloride 108 98 - 111 mmol/L   CO2 24 22 - 32 mmol/L   Glucose, Bld 108 (H) 70 - 99 mg/dL   BUN 17 6 - 20 mg/dL   Creatinine, Ser 1.74 (H) 0.61 - 1.24 mg/dL   Calcium 9.2 8.9 - 10.3 mg/dL  Total Protein 6.8 6.5 - 8.1 g/dL   Albumin 3.9 3.5 - 5.0 g/dL   AST 22 15 - 41 U/L   ALT 20 0 - 44 U/L   Alkaline Phosphatase 57 38 - 126 U/L   Total Bilirubin 0.5 0.3 - 1.2 mg/dL   GFR calc non Af Amer 43 (L) >60 mL/min   GFR calc Af Amer 49 (L) >60 mL/min   Anion gap 9 5 - 15  Rapid urine drug screen (hospital performed)  Result Value Ref Range   Opiates NONE DETECTED NONE DETECTED   Cocaine NONE DETECTED NONE DETECTED   Benzodiazepines NONE DETECTED NONE DETECTED   Amphetamines NONE DETECTED NONE DETECTED   Tetrahydrocannabinol POSITIVE (A) NONE DETECTED   Barbiturates NONE DETECTED NONE DETECTED   Ct Head Wo Contrast  Result Date: 11/25/2018 CLINICAL DATA:  Headache and high blood pressure EXAM: CT HEAD WITHOUT  CONTRAST TECHNIQUE: Contiguous axial images were obtained from the base of the skull through the vertex without intravenous contrast. COMPARISON:  Head CT January 01, 2010 FINDINGS: Brain: No evidence of acute infarction, hemorrhage, hydrocephalus, extra-axial collection or mass lesion/mass effect. Vascular: No hyperdense vessel is noted. Skull: Normal. Negative for fracture or focal lesion. Sinuses/Orbits: Mild mucoperiosteal thickening of bilateral ethmoid sinuses are identified. The orbits are normal. Other: None. IMPRESSION: No focal acute intracranial abnormality identified. Mild mucoperiosteal thickening of bilateral ethmoid sinuses. Electronically Signed   By: Abelardo Diesel M.D.   On: 11/25/2018 17:03    EKG EKG Interpretation  Date/Time:  Tuesday November 25 2018 11:47:22 EDT Ventricular Rate:  76 PR Interval:    QRS Duration: 100 QT Interval:  405 QTC Calculation: 456 R Axis:   -12 Text Interpretation:  Sinus rhythm No previous tracing Nonspecific T wave abnormality Confirmed by Lajean Saver [phone]) on 11/25/2018 11:59:49 AM Also confirmed by Lajean Saver [phone]), editor Philomena Doheny [phone])  on 11/25/2018 12:46:06 PM   Radiology Ct Head Wo Contrast  Result Date: 11/25/2018 CLINICAL DATA:  Headache and high blood pressure EXAM: CT HEAD WITHOUT CONTRAST TECHNIQUE: Contiguous axial images were obtained from the base of the skull through the vertex without intravenous contrast. COMPARISON:  Head CT January 01, 2010 FINDINGS: Brain: No evidence of acute infarction, hemorrhage, hydrocephalus, extra-axial collection or mass lesion/mass effect. Vascular: No hyperdense vessel is noted. Skull: Normal. Negative for fracture or focal lesion. Sinuses/Orbits: Mild mucoperiosteal thickening of bilateral ethmoid sinuses are identified. The orbits are normal. Other: None. IMPRESSION: No focal acute intracranial abnormality identified. Mild mucoperiosteal thickening of bilateral ethmoid sinuses.  Electronically Signed   By: Abelardo Diesel M.D.   On: 11/25/2018 17:03    Procedures Procedures (including critical care time)  Medications Ordered in ED Medications  morphine 4 MG/ML injection 4 mg (has no administration in time range)  metoCLOPramide (REGLAN) injection 10 mg (has no administration in time range)     Initial Impression / Assessment and Plan / ED Course  I have reviewed the triage vital signs and the nursing notes.  Pertinent labs & imaging results that were available during my care of the patient were reviewed by me and considered in my medical decision making (see chart for details).  Iv ns. Morphine iv. reglan iv. Stat labs. Ct imaging head.   Reviewed nursing notes and prior charts for additional history.   Recheck bp, remains high. Labetalol iv.   Labs reviewed by me - lytes normal, cr 1.74 (prior 1.89).  CT reviewed by me - no hem.  Recheck pt, bp improved. Pt comfortable, nad.   Pt appears stable for d/c.   rec pcp f/u.  Return precautions provided.  CRITICAL CARE RE: severe hypertension/hypertensive urgency, requiring iv medication/stat labs Performed by: Mirna Mires Total critical care time: 35 minutes Critical care time was exclusive of separately billable procedures and treating other patients. Critical care was necessary to treat or prevent imminent or life-threatening deterioration. Critical care was time spent personally by me on the following activities: development of treatment plan with patient and/or surrogate as well as nursing, discussions with consultants, evaluation of patient's response to treatment, examination of patient, obtaining history from patient or surrogate, ordering and performing treatments and interventions, ordering and review of laboratory studies, ordering and review of radiographic studies, pulse oximetry and re-evaluation of patient's condition.   Final Clinical Impressions(s) / ED Diagnoses   Final diagnoses:   None    ED Discharge Orders    None           Lajean Saver, MD 12/09/18 1118

## 2018-11-26 ENCOUNTER — Other Ambulatory Visit: Payer: Self-pay

## 2018-11-26 DIAGNOSIS — K219 Gastro-esophageal reflux disease without esophagitis: Secondary | ICD-10-CM

## 2018-11-26 DIAGNOSIS — Z1211 Encounter for screening for malignant neoplasm of colon: Secondary | ICD-10-CM

## 2018-12-02 ENCOUNTER — Emergency Department (HOSPITAL_COMMUNITY): Payer: No Typology Code available for payment source

## 2018-12-02 ENCOUNTER — Emergency Department (HOSPITAL_COMMUNITY)
Admission: EM | Admit: 2018-12-02 | Discharge: 2018-12-02 | Discharge status: Against Medical Advice | Payer: No Typology Code available for payment source | Attending: Emergency Medicine | Admitting: Emergency Medicine

## 2018-12-02 ENCOUNTER — Encounter (HOSPITAL_COMMUNITY): Payer: Self-pay

## 2018-12-02 ENCOUNTER — Other Ambulatory Visit: Payer: Self-pay

## 2018-12-02 DIAGNOSIS — Z5321 Procedure and treatment not carried out due to patient leaving prior to being seen by health care provider: Secondary | ICD-10-CM | POA: Diagnosis not present

## 2018-12-02 DIAGNOSIS — R42 Dizziness and giddiness: Secondary | ICD-10-CM | POA: Diagnosis present

## 2018-12-02 LAB — URINALYSIS, ROUTINE W REFLEX MICROSCOPIC
Bacteria, UA: NONE SEEN
Bilirubin Urine: NEGATIVE
Glucose, UA: NEGATIVE mg/dL
Hgb urine dipstick: NEGATIVE
Ketones, ur: 5 mg/dL — AB
Leukocytes,Ua: NEGATIVE
Nitrite: NEGATIVE
Protein, ur: 30 mg/dL — AB
Specific Gravity, Urine: 1.025 (ref 1.005–1.030)
pH: 5 (ref 5.0–8.0)

## 2018-12-02 LAB — CBC
HCT: 34.2 % — ABNORMAL LOW (ref 39.0–52.0)
Hemoglobin: 11.5 g/dL — ABNORMAL LOW (ref 13.0–17.0)
MCH: 32.2 pg (ref 26.0–34.0)
MCHC: 33.6 g/dL (ref 30.0–36.0)
MCV: 95.8 fL (ref 80.0–100.0)
Platelets: 274 10*3/uL (ref 150–400)
RBC: 3.57 MIL/uL — ABNORMAL LOW (ref 4.22–5.81)
RDW: 13.1 % (ref 11.5–15.5)
WBC: 8 10*3/uL (ref 4.0–10.5)
nRBC: 0 % (ref 0.0–0.2)

## 2018-12-02 LAB — BASIC METABOLIC PANEL
Anion gap: 9 (ref 5–15)
BUN: 35 mg/dL — ABNORMAL HIGH (ref 6–20)
CO2: 23 mmol/L (ref 22–32)
Calcium: 9.5 mg/dL (ref 8.9–10.3)
Chloride: 108 mmol/L (ref 98–111)
Creatinine, Ser: 2.33 mg/dL — ABNORMAL HIGH (ref 0.61–1.24)
GFR calc Af Amer: 35 mL/min — ABNORMAL LOW (ref >60)
GFR calc non Af Amer: 30 mL/min — ABNORMAL LOW (ref >60)
Glucose, Bld: 131 mg/dL — ABNORMAL HIGH (ref 70–99)
Potassium: 3.2 mmol/L — ABNORMAL LOW (ref 3.5–5.1)
Sodium: 140 mmol/L (ref 135–145)

## 2018-12-02 MED ORDER — SODIUM CHLORIDE 0.9% FLUSH: 3.0000 mL | Freq: Once | INTRAVENOUS | Status: DC

## 2018-12-02 NOTE — ED Triage Notes (Signed)
Pt had a fall last week and per bystanders he "collapsed" he does have hx of seizures but states the side effects generally resolve by now. He reports he has had ongoing vertigo making it difficult to even turn over in bed.

## 2018-12-02 NOTE — ED Notes (Signed)
Patient stated he was tired and wanted to go home. Patient has left.

## 2018-12-04 ENCOUNTER — Telehealth (HOSPITAL_COMMUNITY): Payer: Self-pay

## 2018-12-04 DIAGNOSIS — E119 Type 2 diabetes mellitus without complications: Secondary | ICD-10-CM | POA: Insufficient documentation

## 2018-12-04 DIAGNOSIS — B20 Human immunodeficiency virus [HIV] disease: Secondary | ICD-10-CM | POA: Insufficient documentation

## 2018-12-04 DIAGNOSIS — I2510 Atherosclerotic heart disease of native coronary artery without angina pectoris: Secondary | ICD-10-CM | POA: Insufficient documentation

## 2018-12-04 DIAGNOSIS — K635 Polyp of colon: Secondary | ICD-10-CM | POA: Insufficient documentation

## 2018-12-04 DIAGNOSIS — G4730 Sleep apnea, unspecified: Secondary | ICD-10-CM | POA: Insufficient documentation

## 2018-12-04 DIAGNOSIS — I219 Acute myocardial infarction, unspecified: Secondary | ICD-10-CM | POA: Insufficient documentation

## 2018-12-04 DIAGNOSIS — I10 Essential (primary) hypertension: Secondary | ICD-10-CM | POA: Insufficient documentation

## 2018-12-04 DIAGNOSIS — N183 Chronic kidney disease, stage 3 unspecified: Secondary | ICD-10-CM | POA: Insufficient documentation

## 2018-12-04 DIAGNOSIS — F0390 Unspecified dementia without behavioral disturbance: Secondary | ICD-10-CM | POA: Insufficient documentation

## 2018-12-04 DIAGNOSIS — K219 Gastro-esophageal reflux disease without esophagitis: Secondary | ICD-10-CM | POA: Insufficient documentation

## 2018-12-04 DIAGNOSIS — D649 Anemia, unspecified: Secondary | ICD-10-CM | POA: Insufficient documentation

## 2018-12-04 DIAGNOSIS — R569 Unspecified convulsions: Secondary | ICD-10-CM | POA: Insufficient documentation

## 2018-12-09 ENCOUNTER — Other Ambulatory Visit: Payer: Self-pay

## 2018-12-09 ENCOUNTER — Telehealth: Payer: Self-pay

## 2018-12-09 ENCOUNTER — Ambulatory Visit (AMBULATORY_SURGERY_CENTER): Payer: Self-pay

## 2018-12-09 DIAGNOSIS — N183 Chronic kidney disease, stage 3 unspecified: Secondary | ICD-10-CM

## 2018-12-09 DIAGNOSIS — R569 Unspecified convulsions: Secondary | ICD-10-CM

## 2018-12-09 NOTE — Telephone Encounter (Signed)
Pt did not return phone call to reschedule his PV. The endo/colon on 12/26/2018 was cancelled at this time. A letter was mailed to the pt to get his records from the [place] and reschedule his PV and procedures. Gwyndolyn Saxon in [facility]

## 2018-12-09 NOTE — Telephone Encounter (Signed)
Gwyndolyn Saxon, Thanks for update. He did not tolerate his prior procedures as they were not done with MAC, which is what we would be providing. So I think he is still OK for procedures that are scheduled later this month. However, I have not seen any of the EGD/Colonoscopy reports that he was supposed to get. Nevin Bloodgood, have you seen these that you asked for previously? Rovonda, can you try and wrangle up these reports for [place] so that we can review? For now, would keep the current dates, and we will review the data once we get things from the [place]. Thanks to all.  GM

## 2018-12-09 NOTE — Telephone Encounter (Signed)
Dr Rush Landmark, This pt is scheduled for an endo/colon with you on 12/26/2018. During the PV today, the pt states he had an seizure the end of July and went to the ED. He also stated he is hard to sedate due to uncontollable movements and the procedures in Feb, 2020 were incomplete. Does the pt need an office visit first or is he OK for [place]?  Please advise. Thanks, Gwyndolyn Saxon in [facility]

## 2018-12-09 NOTE — Telephone Encounter (Signed)
Called pt and left message to return call. Will need to schedule another PV, since PV was not completed today. Harold Davenport in [facility]

## 2018-12-09 NOTE — Progress Notes (Signed)
Per pt, no allergies to soy or egg products.Pt not taking any weight loss meds or using  O2 at home.  Per pt, hard to sedate with last procedure.  Pt states he had a seizure a couple weeks ago and went to the ER. Message sent to Dr Rush Landmark to advise if pt needs an office visit. The PV was not completed at this time. Gwyndolyn Saxon in [facility]

## 2018-12-10 NOTE — Telephone Encounter (Signed)
Records from [place] placed in your inbox in your office.

## 2018-12-10 NOTE — Telephone Encounter (Signed)
Thank you for obtaining and reviewing. Happy to proceed if [facility] felt necessary with an EGD/Colonoscopy with MAC. Sounds like he needs a 2-day preparation. Please schedule as able. Thanks. GM

## 2018-12-10 NOTE — Telephone Encounter (Signed)
I have his records, will have scanned into Epic. Basically in Feb 2020 he had an EGD - mildly irreg Z line, otherwise normal esophagus. Procedure aborted due to inability to be adequately sedated. Colonoscopy Feb 2020 done for hx of colon polyps. Solid stool throughout colon A 4 mm sigmoid polyp removed - hyperplastic. Recommendation was to repeat with MAC. Thanks

## 2018-12-15 ENCOUNTER — Telehealth: Payer: Self-pay | Admitting: Gastroenterology

## 2018-12-16 NOTE — Telephone Encounter (Signed)
Dr Rush Landmark in regards to seizure is it ok to schedule?

## 2018-12-17 NOTE — Telephone Encounter (Signed)
Patty,  I need to see his last echocardigram in order to clear him.  It probably needs to requested from the [place].  Thanks,  Osvaldo Angst

## 2018-12-17 NOTE — Telephone Encounter (Signed)
John can you please review the chart for procedure in the [facility] with history of seizures and uncontrolled movements with sedation.

## 2018-12-17 NOTE — Telephone Encounter (Signed)
Reading through the notes, it seems that the patient has a history of seizures. If this is true, then I am OK with proceeding with the procedures. However, if this is a new diagnosis of seizures, then we should attempt to get him seen by his PCP and Neurology. Please reach out to our Anesthesia group, I do not believe that this is a contraindication to performing his procedures in the [facility], but if they have concerns then we will need to get him done in the hospital based setting. Please let me know what you are able to find out. GM

## 2018-12-26 ENCOUNTER — Encounter: Payer: Non-veteran care | Admitting: Gastroenterology

## 2019-01-07 ENCOUNTER — Telehealth: Payer: Self-pay

## 2019-01-07 NOTE — Telephone Encounter (Signed)
Pt answered "Yes" to 2 questions:  Do you have any respiratory symptoms of shortness of breath or cough now or in the last 14 days? Yes, cough due to seasonal allergies.  Do you have any family members or close contacts with diagnosed or suspected Covid-19 in the past 14 days? Yes, 2 fam members tested positive for Covid-19 at the beginning of last month but pt was never close to them when they were sick. They already recovered.

## 2019-01-07 NOTE — Telephone Encounter (Signed)
Understood. Patient to be masked before entering the building. All precautions will remain the same with this patient. OK to proceed with scheduled procedure from my end. Thank you for update. GM

## 2019-01-07 NOTE — Telephone Encounter (Signed)
Covid-19 screening questions   Do you now or have you had a fever in the last 14 days?  Do you have any respiratory symptoms of shortness of breath or cough now or in the last 14 days?  Do you have any family members or close contacts with diagnosed or suspected Covid-19 in the past 14 days?  Have you been tested for Covid-19 and found to be positive?       

## 2019-01-08 ENCOUNTER — Encounter: Payer: Self-pay | Admitting: Gastroenterology

## 2019-01-08 ENCOUNTER — Ambulatory Visit (AMBULATORY_SURGERY_CENTER): Payer: No Typology Code available for payment source | Admitting: Gastroenterology

## 2019-01-08 ENCOUNTER — Other Ambulatory Visit: Payer: Self-pay

## 2019-01-08 VITALS — BP 119/83 | HR 83 | Temp 98.8°F | Resp 18 | Ht 69.0 in | Wt 214.0 lb

## 2019-01-08 DIAGNOSIS — Z1211 Encounter for screening for malignant neoplasm of colon: Secondary | ICD-10-CM

## 2019-01-08 DIAGNOSIS — D124 Benign neoplasm of descending colon: Secondary | ICD-10-CM | POA: Diagnosis not present

## 2019-01-08 DIAGNOSIS — D122 Benign neoplasm of ascending colon: Secondary | ICD-10-CM

## 2019-01-08 DIAGNOSIS — K2980 Duodenitis without bleeding: Secondary | ICD-10-CM | POA: Diagnosis not present

## 2019-01-08 DIAGNOSIS — K210 Gastro-esophageal reflux disease with esophagitis: Secondary | ICD-10-CM | POA: Diagnosis not present

## 2019-01-08 DIAGNOSIS — K219 Gastro-esophageal reflux disease without esophagitis: Secondary | ICD-10-CM

## 2019-01-08 DIAGNOSIS — D128 Benign neoplasm of rectum: Secondary | ICD-10-CM

## 2019-01-08 DIAGNOSIS — K449 Diaphragmatic hernia without obstruction or gangrene: Secondary | ICD-10-CM | POA: Diagnosis not present

## 2019-01-08 DIAGNOSIS — D123 Benign neoplasm of transverse colon: Secondary | ICD-10-CM

## 2019-01-08 DIAGNOSIS — D129 Benign neoplasm of anus and anal canal: Secondary | ICD-10-CM

## 2019-01-08 DIAGNOSIS — Z86010 Personal history of colonic polyps: Secondary | ICD-10-CM

## 2019-01-08 MED ORDER — SODIUM CHLORIDE 0.9 % IV SOLN: 500.0000 mL | Freq: Once | INTRAVENOUS | Status: DC

## 2019-01-08 MED ORDER — OMEPRAZOLE 40 MG PO CPDR: 40.0000 mg | DELAYED_RELEASE_CAPSULE | Freq: Two times a day (BID) | ORAL | 2 refills | Status: DC

## 2019-01-08 NOTE — Patient Instructions (Signed)
YOU HAD AN ENDOSCOPIC PROCEDURE TODAY AT [facility]:   Refer to the procedure report that was given to you for any specific questions about what was found during the examination.  If the procedure report does not answer your questions, please call your gastroenterologist to clarify.  If you requested that your care partner not be given the details of your procedure findings, then the procedure report has been included in a sealed envelope for you to review at your convenience later.  *Handouts given on Polyps and hemorrhoids**    YOU SHOULD EXPECT: Some feelings of bloating in the abdomen. Passage of more gas than usual.  Walking can help get rid of the air that was put into your GI tract during the procedure and reduce the bloating. If you had a lower endoscopy (such as a colonoscopy or flexible sigmoidoscopy) you may notice spotting of blood in your stool or on the toilet paper. If you underwent a bowel prep for your procedure, you may not have a normal bowel movement for a few days.  Please Note:  You might notice some irritation and congestion in your nose or some drainage.  This is from the oxygen used during your procedure.  There is no need for concern and it should clear up in a day or so.  SYMPTOMS TO REPORT IMMEDIATELY:   Following lower endoscopy (colonoscopy or flexible sigmoidoscopy):  Excessive amounts of blood in the stool  Significant tenderness or worsening of abdominal pains  Swelling of the abdomen that is new, acute  Fever of 100F or higher   Following upper endoscopy (EGD)  Vomiting of blood or coffee ground material  New chest pain or pain under the shoulder blades  Painful or persistently difficult swallowing  New shortness of breath  Fever of 100F or higher  Black, tarry-looking stools  For urgent or emergent issues, a gastroenterologist can be reached at any hour by calling [phone].   DIET:  We do recommend a small meal at first, but  then you may proceed to your regular diet.  Drink plenty of fluids but you should avoid alcoholic beverages for 24 hours.  ACTIVITY:  You should plan to take it easy for the rest of today and you should NOT DRIVE or use heavy machinery until tomorrow (because of the sedation medicines used during the test).    FOLLOW UP: Our staff will call the number listed on your records 48-72 hours following your procedure to check on you and address any questions or concerns that you may have regarding the information given to you following your procedure. If we do not reach you, we will leave a message.  We will attempt to reach you two times.  During this call, we will ask if you have developed any symptoms of COVID 19. If you develop any symptoms (ie: fever, flu-like symptoms, shortness of breath, cough etc.) before then, please call [phone].  If you test positive for Covid 19 in the 2 weeks post procedure, please call and report this information to [place].    If any biopsies were taken you will be contacted by phone or by letter within the next 1-3 weeks.  Please call us at [phone] if you have not heard about the biopsies in 3 weeks.    SIGNATURES/CONFIDENTIALITY: You and/or your care partner have signed paperwork which will be entered into your electronic medical record.  These signatures attest to the fact that that the information above on  your After Visit Summary has been reviewed and is understood.  Full responsibility of the confidentiality of this discharge information lies with you and/or your care-partner. 

## 2019-01-08 NOTE — Progress Notes (Signed)
Report to PACU, RN, vss, BBS= Clear.  

## 2019-01-08 NOTE — Op Note (Signed)
[place] Patient Name: Harold Davenport Procedure Date: 01/08/2019 2:40 PM MRN: [id number] Endoscopist: Justice Britain , MD Age: 58 Referring MD:  Date of Birth: 1960-07-19 Gender: Male Account #: [id number] Procedure:                Colonoscopy Indications:              Screening for malignant neoplasm in the colon Medicines:                Monitored Anesthesia Care Procedure:                Pre-Anesthesia Assessment:                           - Prior to the procedure, a History and Physical                            was performed, and patient medications and                            allergies were reviewed. The patient's tolerance of                            previous anesthesia was also reviewed. The risks                            and benefits of the procedure and the sedation                            options and risks were discussed with the patient.                            All questions were answered, and informed consent                            was obtained. Prior Anticoagulants: The patient has                            taken no previous anticoagulant or antiplatelet                            agents except for aspirin. ASA Grade Assessment:                            III - A patient with severe systemic disease. After                            reviewing the risks and benefits, the patient was                            deemed in satisfactory condition to undergo the                            procedure.  After obtaining informed consent, the colonoscope                            was passed under direct vision. Throughout the                            procedure, the patient's blood pressure, pulse, and                            oxygen saturations were monitored continuously. The                            Colonoscope was introduced through the anus and                            advanced to the 4 cm into the ileum. The                             colonoscopy was performed without difficulty. The                            patient tolerated the procedure. The quality of the                            bowel preparation was adequate. The terminal ileum,                            ileocecal valve, appendiceal orifice, and rectum                            were photographed. Scope In: 3:07:54 PM Scope Out: 3:31:32 PM Scope Withdrawal Time: 0 hours 22 minutes 1 second  Total Procedure Duration: 0 hours 23 minutes 38 seconds  Findings:                 The digital rectal exam findings include                            hemorrhoids. Pertinent negatives include no                            palpable rectal lesions.                           The terminal ileum and ileocecal valve appeared                            normal.                           A large amount of semi-liquid stool was found in                            the entire colon, interfering with visualization.  Lavage of the area was performed using copious                            amounts, resulting in clearance with adequate                            visualization - greater than 2L of lavage were                            performed to get preparation to adequate status.                           Eight sessile polyps were found in the rectum (3),                            descending colon (1), transverse colon (3) and                            ascending colon (1). The polyps were 2 to 6 mm in                            size. These polyps were removed with a cold snare.                            Resection and retrieval were complete.                           Non-bleeding non-thrombosed internal hemorrhoids                            were found during retroflexion, during perianal                            exam and during digital exam. The hemorrhoids were                            Grade II (internal hemorrhoids that  prolapse but                            reduce spontaneously). Complications:            No immediate complications. Estimated Blood Loss:     Estimated blood loss was minimal. Impression:               - Hemorrhoids found on digital rectal exam.                           - The examined portion of the ileum was normal.                           - Stool in the entire examined colon.                           - Eight 2 to 6 mm polyps in the rectum,  in the                            descending colon, in the transverse colon and in                            the ascending colon, removed with a cold snare.                            Resected and retrieved.                           - Non-bleeding non-thrombosed internal hemorrhoids. Recommendation:           - The patient will be observed post-procedure,                            until all discharge criteria are met.                           - Discharge patient to home.                           - Patient has a contact number available for                            emergencies. The signs and symptoms of potential                            delayed complications were discussed with the                            patient. Return to normal activities tomorrow.                            Written discharge instructions were provided to the                            patient.                           - High fiber diet.                           - Use FiberCon 1 tablet PO daily.                           - Continue present medications.                           - Await pathology results.                           - Repeat colonoscopy in 3 - 5 years for                            surveillance based on pathology  results and                            adenomtous tissue. Needs a 2-day preparation and                            1-week of laxative therapy before.                           - The findings and recommendations were discussed                             with the patient. Justice Britain, MD 01/08/2019 3:55:03 PM

## 2019-01-08 NOTE — Progress Notes (Signed)
Called to room to assist during endoscopic procedure.  Patient ID and intended procedure confirmed with present staff. Received instructions for my participation in the procedure from the performing physician.  

## 2019-01-08 NOTE — Progress Notes (Addendum)
Pt's states no medical or surgical changes since previsit or office visit.  Temp Harold Davenport VS C. Bradford

## 2019-01-08 NOTE — Op Note (Signed)
Harold Davenport: Harold Davenport Procedure Date: 01/08/2019 2:40 PM MRN: [id number] Endoscopist: Justice Britain , MD Age: 58 Referring MD:  Date of Birth: 12-08-1960 Gender: Male Account #: [id number] Procedure:                Upper GI endoscopy Indications:              Heartburn, Abnormal EGD with irregularity in                            esophagus (unable to complete due to sedation at [facility]) Medicines:                Monitored Anesthesia Care Procedure:                Pre-Anesthesia Assessment:                           - Prior to the procedure, a History and Physical                            was performed, and patient medications and                            allergies were reviewed. The patient's tolerance of                            previous anesthesia was also reviewed. The risks                            and benefits of the procedure and the sedation                            options and risks were discussed with the patient.                            All questions were answered, and informed consent                            was obtained. Prior Anticoagulants: The patient has                            taken no previous anticoagulant or antiplatelet                            agents except for aspirin. ASA Grade Assessment:                            III - A patient with severe systemic disease. After                            reviewing the risks and benefits, the patient was                            deemed in satisfactory condition to undergo the  procedure.                           After obtaining informed consent, the endoscope was                            passed under direct vision. Throughout the                            procedure, the patient's blood pressure, pulse, and                            oxygen saturations were monitored continuously. The                            Endoscope was introduced through  the mouth, and                            advanced to the second part of duodenum. The upper                            GI endoscopy was accomplished without difficulty.                            The patient tolerated the procedure. Scope In: Scope Out: Findings:                 No gross lesions were noted in the proximal                            esophagus and in the mid esophagus.                           Scattered islands of salmon-colored mucosa were                            present at 38 cm. No other visible abnormalities                            were present. Biopsies were taken with a cold                            forceps for histology.                           A small hiatal hernia was found. The proximal                            extent of the gastric folds (end of tubular                            esophagus) was 39 cm from the incisors. The hiatal                            narrowing was 42 cm  from the incisors. The Z-line                            was 40 cm from the incisors.                           LA Grade B (one or more mucosal breaks greater than                            5 mm, not extending between the tops of two mucosal                            folds) esophagitis with no bleeding was found at                            the gastroesophageal junction. Biopsies were taken                            with a cold forceps for histology.                           Patchy mildly erythematous mucosa was found in the                            gastric antrum. Biopsies were taken with a cold                            forceps for histology and Helicobacter pylori                            testing.                           One 9 mm submucosal papule (nodule) with no                            bleeding and no stigmata of recent bleeding was                            found on the greater curvature of the gastric                            antrum. This was tunnel  biopsied with a cold                            forceps for histology - suggestive of possible                            lipoma.                           Localized moderately erythematous mucosa without  active bleeding and with no stigmata of bleeding                            was found in the duodenal bulb and the D1/D2 angle.                            This was biopsied with a cold forceps for histology.                           No gross mucosal lesions were noted in the second                            portion of the duodenum. Complications:            No immediate complications. Estimated Blood Loss:     Estimated blood loss was minimal. Impression:               - No gross lesions in proximal/middle esophagus.                           - Salmon-colored mucosa suspicious for Barrett's                            esophagus distally. Biopsied.                           - Small hiatal hernia.                           - LA Grade B esophagitis. Biopsied.                           - Erythematous mucosa in the antrum. Biopsied.                           - One submucosal papule (nodule) found in the                            stomach. Biopsied.                           - Erythematous duodenopathy in bulb and D1/D2                            angle. Biopsied.                           - No gross lesions in the second portion of the                            duodenum. Recommendation:           - Proceed to scheduled colonoscopy.                           - Observe patient's clinical course.                           -  Increase PPI to 40 mg BID for next 2-3 months.                           - Await pathology results.                           - Recommend repeat EGD In 2-3 months with MAC to                            ensure healing of esophagitis. If tunneled biopsies                            of antral submucosal nodule are negative then would                             consider and EUS to evaluate the lesion which can                            be done at that time (would have to be a                            hospital-based case if necessary).                           - Observe patient's clinical course.                           - The findings and recommendations were discussed                            with the patient. Justice Britain, MD 01/08/2019 3:49:31 PM

## 2019-01-12 ENCOUNTER — Telehealth: Payer: Self-pay | Admitting: *Deleted

## 2019-01-12 ENCOUNTER — Telehealth: Payer: Self-pay

## 2019-01-12 NOTE — Telephone Encounter (Signed)
  Follow up Call-  Call back number 01/08/2019  Post procedure Call Back phone  # [phone]  Permission to leave phone message Yes  Some recent data might be hidden     Patient questions:  Do you have a fever, pain , or abdominal swelling? No. Pain Score  0 *  Have you tolerated food without any problems? Yes.    Have you been able to return to your normal activities? Yes.    Do you have any questions about your discharge instructions: Diet   No. Medications  No. Follow up visit  No.  Do you have questions or concerns about your Care? No.  Actions: * If pain score is 4 or above: No action needed, pain <4. 1. Have you developed a fever since your procedure? no  2.   Have you had an respiratory symptoms (SOB or cough) since your procedure? no  3.   Have you tested positive for COVID 19 since your procedure no  4.   Have you had any family members/close contacts diagnosed with the COVID 19 since your procedure?  no   If yes to any of these questions please route to Joylene John, RN and Alphonsa Gin, Therapist, sports.

## 2019-01-12 NOTE — Telephone Encounter (Signed)
First follow up call attempt.  Left message on voicemail to call if any questions or concerns regarding procedure or covid 19.

## 2019-01-20 ENCOUNTER — Encounter: Payer: Self-pay | Admitting: Gastroenterology

## 2019-01-20 ENCOUNTER — Telehealth: Payer: Self-pay

## 2019-01-20 NOTE — Telephone Encounter (Signed)
Mansouraty, Telford Nab., MD  Timothy Lasso, RN        Cabell Lazenby, Also as in my note that will be sent off by the [facility] RN, patient needs to undergo celiac testing with TTG IgG and IgA level. We can do that here if he chooses or he can have that done at the [place] as per my notation in the letter. Thank you. GM

## 2019-01-20 NOTE — Telephone Encounter (Signed)
I have sent all the information to the referring [facility] provider and asked that they contact our office and confirm receipt and let us know if they would like [place] to pursue further testing or if that will be completed with that facility.

## 2019-01-20 NOTE — Telephone Encounter (Signed)
-----   Message from Irving Copas., MD sent at 01/20/2019  8:41 AM EDT ----- Regarding: Follow-up [place] Blue,This patient was referred to [place] through the [place].Can you ensure that these records actually get to the patient's referring provider?Also the patient will need an EGD/EUS in approximately 3 to 4 months to evaluate her esophagitis as well as the submucosal lesion and consider a possible EMR if safe. Please find out with the referring GI group whether this is something they would like to pursue through the [place] or if we need to pursue here.  I am happy to be available at any time point for his follow-up procedures.May be it would be safest while you are trying to find out from the [place] to put in a 3 to 25-month EGD EUS recall, but I will defer that to you. Please let me know what you find out.Thank you.GM

## 2019-01-28 ENCOUNTER — Telehealth: Payer: Self-pay | Admitting: Gastroenterology

## 2019-01-28 NOTE — Telephone Encounter (Signed)
Dr Vevelyn Francois I have the pt on the list for EUS EGD in 3-4 months.  Pathology was sent   This patient was referred to [place] through the [place].Can you ensure that these records actually get to the patient's referring provider?Also the patient will need an EGD/EUS in approximately 3 to 4 months to evaluate her esophagitis as well as the submucosal lesion and consider a possible EMR if safe. Please find out with the referring GI group whether this is something they would like to pursue through the [place] or if we need to pursue here.  I am happy to be available at any time point for his follow-up procedures.May be it would be safest while you are trying to find out from the [place] to put in a 3 to 33-month EGD EUS recall, but I will defer that to you. Please let me know what you find out.Thank you.GM

## 2019-01-28 NOTE — Telephone Encounter (Signed)
Patty please keep the current EUS/EGD and reach out to her 1 month before 4 months time.. I did want make sure that they have moved forward with getting what ever was needed done at the [place] otherwise I would like to move forward with the procedure. Thanks. GM

## 2019-01-28 NOTE — Telephone Encounter (Signed)
Recall in for EGD/EUS in 3 months.  Will call the [facility] and confirm pt has completed work up at the [place]

## 2019-03-05 NOTE — Telephone Encounter (Signed)
Rovanda have you seen these records?

## 2019-03-05 NOTE — Telephone Encounter (Signed)
Hi Patty, pt called inquiring about referral and records from the [place]. [facility] told [facility]28 but we do not have it here.Is it possible that you have it? Please call the pt to let him know. Thank you.

## 2019-03-12 NOTE — Telephone Encounter (Signed)
Dr Rush Landmark see the note from [place].  The [facility] states that the referral is new and wish to proceed with our office.

## 2019-03-12 NOTE — Telephone Encounter (Signed)
The pt returned call and has been scheduled for office visit to discuss EGD/EMR 04/30/19 at 1110 am

## 2019-03-12 NOTE — Telephone Encounter (Signed)
No answer and no voice mail.

## 2019-03-12 NOTE — Telephone Encounter (Addendum)
Harold Davenport,   Dr Rush Landmark had the records in his office. Per his recommendations [facility] and see if [facility] want to [facility] this or is this a new referral. I called and spoke with Cleone Slim and she states that this is new referral for gastroenterology. I will send records down for you to look over.

## 2019-03-12 NOTE — Telephone Encounter (Signed)
Thank you for update Patty and Rovonda. Let's have patient RTC and discuss the role of EUS/EGD with possible EMR. We can move forward with getting him on the books/waiting list however for his procedures. 90 minute. Thanks. GM

## 2019-03-24 NOTE — Telephone Encounter (Signed)
Reading through the records that are now available, the [place] is sending up the new referral for [place] to go ahead and proceed with a repeat EGD and EUS.  We will have the patient come in for a clinic visit to discuss the role of repeat EUS to evaluate the submucosal nodule and depending on how things look it is not consistent with a lipoma on EUS consider the role of an EMR.  Patient is scheduled for an appointment in December.  His EUS will be scheduled thereafter. We will follow-up his esophagitis as well. We will have his [place] records scanned into the chart and be available.  Justice Britain, MD [facility] Office # [phone]

## 2019-04-30 ENCOUNTER — Other Ambulatory Visit (INDEPENDENT_AMBULATORY_CARE_PROVIDER_SITE_OTHER)

## 2019-04-30 ENCOUNTER — Ambulatory Visit (INDEPENDENT_AMBULATORY_CARE_PROVIDER_SITE_OTHER): Admitting: Gastroenterology

## 2019-04-30 ENCOUNTER — Encounter: Payer: Self-pay | Admitting: Gastroenterology

## 2019-04-30 VITALS — BP 124/90 | HR 100 | Temp 98.5°F | Ht 69.0 in | Wt 199.4 lb

## 2019-04-30 DIAGNOSIS — K319 Disease of stomach and duodenum, unspecified: Secondary | ICD-10-CM

## 2019-04-30 DIAGNOSIS — K3189 Other diseases of stomach and duodenum: Secondary | ICD-10-CM

## 2019-04-30 DIAGNOSIS — D72820 Lymphocytosis (symptomatic): Secondary | ICD-10-CM | POA: Diagnosis not present

## 2019-04-30 DIAGNOSIS — K2980 Duodenitis without bleeding: Secondary | ICD-10-CM | POA: Insufficient documentation

## 2019-04-30 DIAGNOSIS — K209 Esophagitis, unspecified without bleeding: Secondary | ICD-10-CM | POA: Insufficient documentation

## 2019-04-30 LAB — BASIC METABOLIC PANEL
BUN: 27 mg/dL — ABNORMAL HIGH (ref 6–23)
CO2: 25 mEq/L (ref 19–32)
Calcium: 9.7 mg/dL (ref 8.4–10.5)
Chloride: 108 mEq/L (ref 96–112)
Creatinine, Ser: 1.99 mg/dL — ABNORMAL HIGH (ref 0.40–1.50)
GFR: 41.97 mL/min — ABNORMAL LOW (ref >60.00)
Glucose, Bld: 94 mg/dL (ref 70–99)
Potassium: 4.1 mEq/L (ref 3.5–5.1)
Sodium: 140 mEq/L (ref 135–145)

## 2019-04-30 LAB — CBC
HCT: 40.8 % (ref 39.0–52.0)
Hemoglobin: 13.8 g/dL (ref 13.0–17.0)
MCHC: 33.9 g/dL (ref 30.0–36.0)
MCV: 96 fl (ref 78.0–100.0)
Platelets: 282 10*3/uL (ref 150.0–400.0)
RBC: 4.24 Mil/uL (ref 4.22–5.81)
RDW: 14.3 % (ref 11.5–15.5)
WBC: 6.2 10*3/uL (ref 4.0–10.5)

## 2019-04-30 LAB — PROTIME-INR
INR: 0.9 ratio (ref 0.8–1.0)
Prothrombin Time: 10.7 s (ref 9.6–13.1)

## 2019-04-30 NOTE — Patient Instructions (Signed)
If you are age 58 or younger, your body mass index should be between 19-25. Your Body mass index is 29.44 kg/m. If this is out of the aformentioned range listed, please consider follow up with your Primary Care Provider.   Your provider has requested that you go to the basement level for lab work before leaving today. Press "B" on the elevator. The lab is located at the first door on the left as you exit the elevator.  Due to recent changes in healthcare laws, you may see the results of your imaging and laboratory studies on MyChart before your provider has had a chance to review them.  We understand that in some cases there may be results that are confusing or concerning to you. Not all laboratory results come back in the same time frame and the provider may be waiting for multiple results in order to interpret others.  Please give [place] 48 hours in order for your provider to thoroughly review all the results before contacting the office for clarification of your results.   You have been scheduled for an endoscopy. Please follow written instructions given to you at your visit today. If you use inhalers (even only as needed), please bring them with you on the day of your procedure. .   Thank you for choosing me and [facility].  Dr. Rush Landmark

## 2019-04-30 NOTE — H&P (View-Only) (Signed)
[place] VISIT   Primary Care Provider [place], [place] [address] [place] [address] [phone]  Patient Profile: Harold Davenport is a 58 y.o. male with a pmh significant for HIV, polysubstance abuse (in the past), seizure disorder, hypertension, chronic renal insufficiency, colon polyps, esophagitis/gastritis/duodenitis (negative for H. pylori), OSA, CAD.  The patient presents to the [facility] for an evaluation and management of problem(s) noted below:  Problem List 1. Subepithelial gastric lesion   2. Gastric nodule   3. Lymphocytosis   4. Duodenitis   5. Esophagitis     History of Present Illness Please see initial consultation note by PA [name] for full details of HPI.  Interval History The patient's initial referral was for repeat EGD and colonoscopy attempt with monitored anesthesia care due to issues as noted on prior consultation note.  Once he is information was sent back to the [place] they felt that they would not be able to provide an endoscopic ultrasound so they have referred the patient back for consideration of EUS.  At the time of his EGD I found a submucosal lesion and attempted tunnel biopsies but did not garner a diagnosis.  The lesion itself looked to be subcentimeter in size.  It is for this reason, the patient is referred back so that we can discuss possible EUS.  The patient is not having any symptoms at this time from a GI perspective and just wants to stay as healthy as he can because he is getting older.  GI Review of Systems Positive as above Negative for pyrosis, dysphagia, odynophagia, decreased appetite, anorexia, change in bowel habits, melena, hematochezia  Review of Systems General: Positive for intentional weight loss; denies fevers/chills HEENT: Denies oral lesions Cardiovascular: Denies chest pain/palpitations Pulmonary: Denies shortness of breath Gastroenterological: See  HPI Genitourinary: Denies darkened urine Hematological: Denies easy bruising/bleeding Dermatological: Denies jaundice Psychological: Mood is stable   Medications Current Outpatient Medications  Medication Sig Dispense Refill  . amLODipine (NORVASC) 10 MG tablet Take 10 mg by mouth daily.    Marland Kitchen aspirin EC 81 MG tablet Take 81 mg by mouth daily.    . Bictegravir-Emtricitab-Tenofov (BIKTARVY PO) Take 1 tablet by mouth daily.    . cholecalciferol (VITAMIN D) 25 MCG (1000 UT) tablet Take 1 tablet by mouth daily.    . cromolyn (NASALCROM) 5.2 MG/ACT nasal spray Place 1 spray into both nostrils 3 (three) times daily.    . diclofenac sodium (VOLTAREN) 1 % GEL Apply 2 g topically 2 (two) times a day.    . diphenhydrAMINE (BENADRYL) 25 mg capsule Take 25 mg by mouth 2 (two) times a day.    . gabapentin (NEURONTIN) 300 MG capsule Take 300 mg by mouth 2 (two) times daily.    . hydrochlorothiazide (HYDRODIURIL) 25 MG tablet Take 25 mg by mouth every morning.    . insulin aspart (NOVOLOG) 100 unit/mL injection Inject into the skin 3 (three) times daily before meals. On sliding scale. If blood surgar is over 150 patient will take    . INSULIN GLARGINE [place] Inject 40 Units into the skin at bedtime.     . isosorbide mononitrate (IMDUR) 60 MG 24 hr tablet Take 60 mg by mouth daily.    Marland Kitchen LANTUS SOLOSTAR 100 UNIT/ML Solostar Pen 100 Units.    Marland Kitchen levETIRAcetam (KEPPRA) 750 MG tablet     . lisinopril (ZESTRIL) 20 MG tablet Take 40 mg by mouth daily.     . mirtazapine (REMERON) 15  MG tablet Take 15 mg by mouth at bedtime.    Marland Kitchen NOVOLOG 100 UNIT/ML injection Inject 100 Units into the skin.    Marland Kitchen omeprazole (PRILOSEC) 40 MG capsule Take 1 capsule (40 mg total) by mouth 2 (two) times daily. 60 capsule 2  . potassium chloride (MICRO-K) 10 MEQ CR capsule Take 20 capsules by mouth daily.    . pravastatin (PRAVACHOL) 20 MG tablet Take 20 mg by mouth daily.    Marland Kitchen REFRESH 1.4-0.6 % SOLN Place 1 drop into both eyes as needed.     . tamsulosin (FLOMAX) 0.4 MG CAPS capsule Take 0.4 mg by mouth at bedtime.    . traZODone (DESYREL) 50 MG tablet Take 50 mg by mouth at bedtime.    Marland Kitchen venlafaxine XR (EFFEXOR-XR) 37.5 MG 24 hr capsule Take 37.5 mg by mouth daily with breakfast.     No current facility-administered medications for this visit.    Allergies Allergies  Allergen Reactions  . Crixivan [Indinavir]     Swelling  . Scallops [Shellfish Allergy]     Swelling  . Sustiva [Efavirenz]     Water Retention  . Ampicillin     Rash  . Compazine [Prochlorperazine Edisylate]     Twisted Face    Histories Past Medical History:  Diagnosis Date  . Adjustment disorder with depressed mood   . Alcohol dependence ([place])   . Allergic rhinitis   . Anal fissure   . Anemia   . Arthritis   . 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 CAD (coronary artery disease)   . Cannabis dependence ([place])   . Carpal tunnel syndrome   . Chronic kidney disease, stage 3, mod decreased GFR   . Cocaine dependence ([place])   . Colon polyps   . Complication of anesthesia    per pt, had uncontrollable movement during sedation./last procedure imcomplete  . Dementia ([place])   . Depression   . Diabetes ([place])   . GERD (gastroesophageal reflux disease)   . Heart attack ([place])    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  . HIV (human immunodeficiency virus infection) ([place])   . Hypertension   . Renal failure   . Seizure ([place])    last  . Sleep apnea    have a cpap but use it when he is "Heavy"   Past Surgical History:  Procedure Laterality Date  . COLONOSCOPY     [place], [place] at the [place]. First one done at 84. Recently had one done (2019 or 2020) and wasn't completed  . CORONARY STENT [place], [place] at the [place]. Thinks it was done 2020 it was incomplete   Social History   Socioeconomic History  . Marital status: Single    Spouse name: Not on file  . Number of children: 2  . Years of  education: Not on file  . Highest education level: Not on file  Occupational History  . Occupation: Retired Psychologist, clinical  Tobacco Use  . Smoking status: Current Every Day Smoker  . Smokeless tobacco: Never Used  . Tobacco comment: smokes a pack a week  Substance and Sexual Activity  . Alcohol use: Not Currently  . Drug use: Yes    Types: Marijuana    Comment:  4 times a week  . Sexual activity: Not on file  Other Topics Concern  . Not on file  Social History Narrative  . Not  on file   Social Determinants of Health   Financial Resource Strain:   . Difficulty of Paying Living Expenses: Not on file  Food Insecurity:   . Worried About Charity fundraiser in the Last Year: Not on file  . Ran Out of Food in the Last Year: Not on file  Transportation Needs:   . Lack of Transportation (Medical): Not on file  . Lack of Transportation (Non-Medical): Not on file  Physical Activity:   . Days of Exercise per Week: Not on file  . Minutes of Exercise per Session: Not on file  Stress:   . Feeling of Stress : Not on file  Social Connections:   . Frequency of Communication with Friends and Family: Not on file  . Frequency of Social Gatherings with Friends and Family: Not on file  . Attends Religious Services: Not on file  . Active Member of Clubs or Organizations: Not on file  . Attends Archivist Meetings: Not on file  . Marital Status: Not on file  Intimate Partner Violence:   . Fear of Current or Ex-Partner: Not on file  . Emotionally Abused: Not on file  . Physically Abused: Not on file  . Sexually Abused: Not on file   Family History  Problem Relation Age of Onset  . Diabetes Mother   . Diabetes Father   . Diabetes Sister        has 4 sisters  . Diabetes Brother   . Diabetes Brother   . Diabetes Brother   . Colon cancer Neg Hx   . Esophageal cancer Neg Hx   . Rectal cancer Neg Hx   . Stomach cancer Neg Hx    I have reviewed his medical, social, and family history in  detail and updated the electronic medical record as necessary.    PHYSICAL EXAMINATION  BP 124/90 (BP Location: Left Arm, Patient Position: Sitting, Cuff Size: Normal)   Pulse 100   Temp 98.5 F (36.9 C)   Ht 5\' 9"  (1.753 m) Comment: height measured without shoes  Wt 199 lb 6 oz (90.4 kg)   BMI 29.44 kg/m  Wt Readings from Last 3 Encounters:  04/30/19 199 lb 6 oz (90.4 kg)  01/08/19 214 lb (97.1 kg)  12/09/18 214 lb (97.1 kg)  GEN: NAD, appears stated age, doesn't appear chronically ill PSYCH: Cooperative, without pressured speech EYE: Conjunctivae pink, sclerae anicteric ENT: MMM CV: RR without R/Gs  RESP: CTAB posteriorly, without wheezing GI: NABS, soft, NT/ND, without rebound or guarding MSK/EXT: No significant lower extremity edema SKIN: No jaundice NEURO:  Alert & Oriented x 3, no focal deficits   REVIEW OF DATA  I reviewed the following data at the time of this encounter:  GI Procedures and Studies  September 2020 EGD - No gross lesions in proximal/middle esophagus. - Salmon-colored mucosa suspicious for Barrett's esophagus distally. Biopsied. - Small hiatal hernia. - LA Grade B esophagitis. Biopsied. - Erythematous mucosa in the antrum. Biopsied. - One submucosal papule (nodule) found in the stomach. Biopsied. - Erythematous duodenopathy in bulb and D1/D2 angle. Biopsied. - No gross lesions in the second portion of the duodenum.  Pathology Diagnosis 1. Surgical [P], gastric BX - GASTRIC ANTRAL AND OXYNTIC MUCOSA WITH NO SPECIFIC HISTOPATHOLOGIC CHANGES - WARTHIN STARRY STAIN IS NEGATIVE FOR HELICOBACTER PYLORI 2. Surgical [P], stomach, antral - GASTRIC ANTRAL MUCOSA WITH NO SPECIFIC HISTOPATHOLOGIC CHANGES - WARTHIN STARRY STAIN IS NEGATIVE FOR HELICOBACTER PYLORI - NO SUBMUCOSA IS PRESENT FOR  EVALUATION 3. Surgical [P], duodenal bulb - CHRONIC DUODENITIS WITH PATCHY SURFACE GASTRIC FOVEOLAR METAPLASIA AND MILD VILLOUS ARCHITECTURAL CHANGES. SEE NOTE 4.  Surgical [P], esophagus - ESOPHAGEAL SQUAMOUS AND CARDIAC MUCOSA WITH FOCAL MARKED REACTIVE CHANGES, SUGGESTIVE OF ADJACENT EROSION - NEGATIVE FOR INTESTINAL METAPLASIA OR DYSPLASIA 5. Surgical [P], distal esophagus - ESOPHAGEAL SQUAMOUS AND CARDIAC MUCOSA WITH NO SPECIFIC HISTOPATHOLOGIC CHANGES - NEGATIVE FOR INTESTINAL METAPLASIA OR DYSPLASIA Diagnosis Note 3. Duodenal biopsies show only very focal intraepithelial lymphocytosis. Overall findings are most suggestive of peptic duodenitis but celiac disease cannot be entirely ruled out. Clinical-serologic correlation is suggested  September 2020 Colonoscopy - Hemorrhoids found on digital rectal exam. - The examined portion of the ileum was normal. - Stool in the entire examined colon. - Eight 2 to 6 mm polyps in the rectum, in the descending colon, in the transverse colon and in the ascending colon, removed with a cold snare. Resected and retrieved. - Non-bleeding non-thrombosed internal hemorrhoids.  Pathology Tubular Adenomas Plan for 3-year colonoscopy follow up  Laboratory Studies  Reviewed those in EPIC  Imaging Studies  No relevant studies to review   ASSESSMENT  Mr. Johnes is a 58 y.o. male  with a pmh significant for HIV, polysubstance abuse (in the past), seizure disorder, hypertension, chronic renal insufficiency, colon polyps, esophagitis/gastritis/duodenitis (negative for H. pylori), OSA, CAD.  The patient is seen today for evaluation and management of:  1. Subepithelial gastric lesion   2. Gastric nodule   3. Lymphocytosis   4. Duodenitis   5. Esophagitis    The patient is clinically and hemodynamically stable.  He has a likely subcentimeter subepithelial gastric lesion that will benefit from potential EUS and potential EMR depending on what it looks like and in what region of the wall it is coming from.  We will plan to also repeat a full endoscopy to evaluate his prior esophagitis/gastritis/duodenitis.  He also  had some intraepithelial lymphocytosis and did not obtain celiac serologies as had been described in the results letter.  We will plan to try and get those labs ordered today if we cannot get them done today we will get them when he comes in for his EGD/EUS with possible EMR in the coming weeks.  I do not anticipate a pancreas biopsy.  The risks of EUS including bleeding, infection, aspiration pneumonia and intestinal perforation were discussed as was the possibility it may not give a definitive diagnosis.  If a biopsy of the pancreas is done as part of the EUS, there is an additional risk of pancreatitis at the rate of about 1%.  It was explained that procedure related pancreatitis is typically mild, although can be severe and even life threatening, which is why we do not perform random pancreatic biopsies and only biopsy a lesion we feel is concerning enough to warrant the risk.  The risks and benefits of endoscopic evaluation were discussed with the patient; these include but are not limited to the risk of perforation, infection, bleeding, missed lesions, lack of diagnosis, severe illness requiring hospitalization, as well as anesthesia and sedation related illnesses.  The patient is agreeable to proceed.  All patient questions were answered, to the best of my ability, and the patient agrees to the aforementioned plan of action with follow-up as indicated.   PLAN  Plan to proceed with EUS/EGD with possible EMR Preprocedure laboratories as outlined below Continue PPI therapy at current dosing and will adjust after follow-up EGD is performed   Orders Placed This Encounter  Procedures  . Procedural/ Surgical Case Request: UPPER ENDOSCOPIC ULTRASOUND (EUS) RADIAL, ENDOSCOPIC MUCOSAL RESECTION  . Basic Metabolic Panel (BMET)  . INR/PT  . CBC  . Ambulatory referral to Gastroenterology    New Prescriptions   No medications on file   Modified Medications   No medications on file    Planned Follow  Up No follow-ups on file.   Justice Britain, MD [facility] Office # [id number]

## 2019-04-30 NOTE — Progress Notes (Signed)
[place] VISIT   Primary Care Provider [place], [place] [address] [place] [address] [phone]  Patient Profile: Harold Davenport is a 58 y.o. male with a pmh significant for HIV, polysubstance abuse (in the past), seizure disorder, hypertension, chronic renal insufficiency, colon polyps, esophagitis/gastritis/duodenitis (negative for H. pylori), OSA, CAD.  The patient presents to the [facility] for an evaluation and management of problem(s) noted below:  Problem List 1. Subepithelial gastric lesion   2. Gastric nodule   3. Lymphocytosis   4. Duodenitis   5. Esophagitis     History of Present Illness Please see initial consultation note by [facility] for full details of HPI.  Interval History The patient's initial referral was for repeat EGD and colonoscopy attempt with monitored anesthesia care due to issues as noted on prior consultation note.  Once he is information was sent back to the [place] they felt that they would not be able to provide an endoscopic ultrasound so they have referred the patient back for consideration of EUS.  At the time of his EGD I found a submucosal lesion and attempted tunnel biopsies but did not garner a diagnosis.  The lesion itself looked to be subcentimeter in size.  It is for this reason, the patient is referred back so that we can discuss possible EUS.  The patient is not having any symptoms at this time from a GI perspective and just wants to stay as healthy as he can because he is getting older.  GI Review of Systems Positive as above Negative for pyrosis, dysphagia, odynophagia, decreased appetite, anorexia, change in bowel habits, melena, hematochezia  Review of Systems General: Positive for intentional weight loss; denies fevers/chills HEENT: Denies oral lesions Cardiovascular: Denies chest pain/palpitations Pulmonary: Denies shortness of breath Gastroenterological: See  HPI Genitourinary: Denies darkened urine Hematological: Denies easy bruising/bleeding Dermatological: Denies jaundice Psychological: Mood is stable   Medications Current Outpatient Medications  Medication Sig Dispense Refill  . amLODipine (NORVASC) 10 MG tablet Take 10 mg by mouth daily.    Marland Kitchen aspirin EC 81 MG tablet Take 81 mg by mouth daily.    . Bictegravir-Emtricitab-Tenofov (BIKTARVY PO) Take 1 tablet by mouth daily.    . cholecalciferol (VITAMIN D) 25 MCG (1000 UT) tablet Take 1 tablet by mouth daily.    . cromolyn (NASALCROM) 5.2 MG/ACT nasal spray Place 1 spray into both nostrils 3 (three) times daily.    . diclofenac sodium (VOLTAREN) 1 % GEL Apply 2 g topically 2 (two) times a day.    . diphenhydrAMINE (BENADRYL) 25 mg capsule Take 25 mg by mouth 2 (two) times a day.    . gabapentin (NEURONTIN) 300 MG capsule Take 300 mg by mouth 2 (two) times daily.    . hydrochlorothiazide (HYDRODIURIL) 25 MG tablet Take 25 mg by mouth every morning.    . insulin aspart (NOVOLOG) 100 unit/mL injection Inject into the skin 3 (three) times daily before meals. On sliding scale. If blood surgar is over 150 patient will take    . INSULIN GLARGINE [place] Inject 40 Units into the skin at bedtime.     . isosorbide mononitrate (IMDUR) 60 MG 24 hr tablet Take 60 mg by mouth daily.    Marland Kitchen LANTUS SOLOSTAR 100 UNIT/ML Solostar Pen 100 Units.    Marland Kitchen levETIRAcetam (KEPPRA) 750 MG tablet     . lisinopril (ZESTRIL) 20 MG tablet Take 40 mg by mouth daily.     . mirtazapine (REMERON) 15  MG tablet Take 15 mg by mouth at bedtime.    Marland Kitchen NOVOLOG 100 UNIT/ML injection Inject 100 Units into the skin.    Marland Kitchen omeprazole (PRILOSEC) 40 MG capsule Take 1 capsule (40 mg total) by mouth 2 (two) times daily. 60 capsule 2  . potassium chloride (MICRO-K) 10 MEQ CR capsule Take 20 capsules by mouth daily.    . pravastatin (PRAVACHOL) 20 MG tablet Take 20 mg by mouth daily.    Marland Kitchen REFRESH 1.4-0.6 % SOLN Place 1 drop into both eyes as needed.     . tamsulosin (FLOMAX) 0.4 MG CAPS capsule Take 0.4 mg by mouth at bedtime.    . traZODone (DESYREL) 50 MG tablet Take 50 mg by mouth at bedtime.    Marland Kitchen venlafaxine XR (EFFEXOR-XR) 37.5 MG 24 hr capsule Take 37.5 mg by mouth daily with breakfast.     No current facility-administered medications for this visit.    Allergies Allergies  Allergen Reactions  . Crixivan [Indinavir]     Swelling  . Scallops [Shellfish Allergy]     Swelling  . Sustiva [Efavirenz]     Water Retention  . Ampicillin     Rash  . Compazine [Prochlorperazine Edisylate]     Twisted Face    Histories Past Medical History:  Diagnosis Date  . Adjustment disorder with depressed mood   . Alcohol dependence ([place])   . Allergic rhinitis   . Anal fissure   . Anemia   . Arthritis   . 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 CAD (coronary artery disease)   . Cannabis dependence ([place])   . Carpal tunnel syndrome   . Chronic kidney disease, stage 3, mod decreased GFR   . Cocaine dependence ([place])   . Colon polyps   . Complication of anesthesia    per pt, had uncontrollable movement during sedation./last procedure imcomplete  . Dementia ([place])   . Depression   . Diabetes ([place])   . GERD (gastroesophageal reflux disease)   . Heart attack ([place])    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  . HIV (human immunodeficiency virus infection) ([place])   . Hypertension   . Renal failure   . Seizure ([place])    last  . Sleep apnea    have a cpap but use it when he is "Heavy"   Past Surgical History:  Procedure Laterality Date  . COLONOSCOPY     [place], [place] at the [place]. First one done at 47. Recently had one done (2019 or 2020) and wasn't completed  . CORONARY [place], [place] at the [place]. Thinks it was done 2020 it was incomplete   Social History   Socioeconomic History  . Marital status: Single    Spouse name: Not on file  . Number of children: 2  . Years of  education: Not on file  . Highest education level: Not on file  Occupational History  . Occupation: Retired Psychologist, clinical  Tobacco Use  . Smoking status: Current Every Day Smoker  . Smokeless tobacco: Never Used  . Tobacco comment: smokes a pack a week  Substance and Sexual Activity  . Alcohol use: Not Currently  . Drug use: Yes    Types: Marijuana    Comment:  4 times a week  . Sexual activity: Not on file  Other Topics Concern  . Not on file  Social History Narrative  . Not  on file   Social Determinants of Health   Financial Resource Strain:   . Difficulty of Paying Living Expenses: Not on file  Food Insecurity:   . Worried About Charity fundraiser in the Last Year: Not on file  . Ran Out of Food in the Last Year: Not on file  Transportation Needs:   . Lack of Transportation (Medical): Not on file  . Lack of Transportation (Non-Medical): Not on file  Physical Activity:   . Days of Exercise per Week: Not on file  . Minutes of Exercise per Session: Not on file  Stress:   . Feeling of Stress : Not on file  Social Connections:   . Frequency of Communication with Friends and Family: Not on file  . Frequency of Social Gatherings with Friends and Family: Not on file  . Attends Religious Services: Not on file  . Active Member of Clubs or Organizations: Not on file  . Attends Archivist Meetings: Not on file  . Marital Status: Not on file  Intimate Partner Violence:   . Fear of Current or Ex-Partner: Not on file  . Emotionally Abused: Not on file  . Physically Abused: Not on file  . Sexually Abused: Not on file   Family History  Problem Relation Age of Onset  . Diabetes Mother   . Diabetes Father   . Diabetes Sister        has 4 sisters  . Diabetes Brother   . Diabetes Brother   . Diabetes Brother   . Colon cancer Neg Hx   . Esophageal cancer Neg Hx   . Rectal cancer Neg Hx   . Stomach cancer Neg Hx    I have reviewed his medical, social, and family history in  detail and updated the electronic medical record as necessary.    PHYSICAL EXAMINATION  BP 124/90 (BP Location: Left Arm, Patient Position: Sitting, Cuff Size: Normal)   Pulse 100   Temp 98.5 F (36.9 C)   Ht 5\' 9"  (1.753 m) Comment: height measured without shoes  Wt 199 lb 6 oz (90.4 kg)   BMI 29.44 kg/m  Wt Readings from Last 3 Encounters:  04/30/19 199 lb 6 oz (90.4 kg)  01/08/19 214 lb (97.1 kg)  12/09/18 214 lb (97.1 kg)  GEN: NAD, appears stated age, doesn't appear chronically ill PSYCH: Cooperative, without pressured speech EYE: Conjunctivae pink, sclerae anicteric ENT: MMM CV: RR without R/Gs  RESP: CTAB posteriorly, without wheezing GI: NABS, soft, NT/ND, without rebound or guarding MSK/EXT: No significant lower extremity edema SKIN: No jaundice NEURO:  Alert & Oriented x 3, no focal deficits   REVIEW OF DATA  I reviewed the following data at the time of this encounter:  GI Procedures and Studies  September 2020 EGD - No gross lesions in proximal/middle esophagus. - Salmon-colored mucosa suspicious for Barrett's esophagus distally. Biopsied. - Small hiatal hernia. - LA Grade B esophagitis. Biopsied. - Erythematous mucosa in the antrum. Biopsied. - One submucosal papule (nodule) found in the stomach. Biopsied. - Erythematous duodenopathy in bulb and D1/D2 angle. Biopsied. - No gross lesions in the second portion of the duodenum.  Pathology Diagnosis 1. Surgical [P], gastric BX - GASTRIC ANTRAL AND OXYNTIC MUCOSA WITH NO SPECIFIC HISTOPATHOLOGIC CHANGES - WARTHIN STARRY STAIN IS NEGATIVE FOR HELICOBACTER PYLORI 2. Surgical [P], stomach, antral - GASTRIC ANTRAL MUCOSA WITH NO SPECIFIC HISTOPATHOLOGIC CHANGES - WARTHIN STARRY STAIN IS NEGATIVE FOR HELICOBACTER PYLORI - NO SUBMUCOSA IS PRESENT FOR  EVALUATION 3. Surgical [P], duodenal bulb - CHRONIC DUODENITIS WITH PATCHY SURFACE GASTRIC FOVEOLAR METAPLASIA AND MILD VILLOUS ARCHITECTURAL CHANGES. SEE NOTE 4.  Surgical [P], esophagus - ESOPHAGEAL SQUAMOUS AND CARDIAC MUCOSA WITH FOCAL MARKED REACTIVE CHANGES, SUGGESTIVE OF ADJACENT EROSION - NEGATIVE FOR INTESTINAL METAPLASIA OR DYSPLASIA 5. Surgical [P], distal esophagus - ESOPHAGEAL SQUAMOUS AND CARDIAC MUCOSA WITH NO SPECIFIC HISTOPATHOLOGIC CHANGES - NEGATIVE FOR INTESTINAL METAPLASIA OR DYSPLASIA Diagnosis Note 3. Duodenal biopsies show only very focal intraepithelial lymphocytosis. Overall findings are most suggestive of peptic duodenitis but celiac disease cannot be entirely ruled out. Clinical-serologic correlation is suggested  September 2020 Colonoscopy - Hemorrhoids found on digital rectal exam. - The examined portion of the ileum was normal. - Stool in the entire examined colon. - Eight 2 to 6 mm polyps in the rectum, in the descending colon, in the transverse colon and in the ascending colon, removed with a cold snare. Resected and retrieved. - Non-bleeding non-thrombosed internal hemorrhoids.  Pathology Tubular Adenomas Plan for 3-year colonoscopy follow up  Laboratory Studies  Reviewed those in EPIC  Imaging Studies  No relevant studies to review   ASSESSMENT  Mr. Scholler is a 58 y.o. male  with a pmh significant for HIV, polysubstance abuse (in the past), seizure disorder, hypertension, chronic renal insufficiency, colon polyps, esophagitis/gastritis/duodenitis (negative for H. pylori), OSA, CAD.  The patient is seen today for evaluation and management of:  1. Subepithelial gastric lesion   2. Gastric nodule   3. Lymphocytosis   4. Duodenitis   5. Esophagitis    The patient is clinically and hemodynamically stable.  He has a likely subcentimeter subepithelial gastric lesion that will benefit from potential EUS and potential EMR depending on what it looks like and in what region of the wall it is coming from.  We will plan to also repeat a full endoscopy to evaluate his prior esophagitis/gastritis/duodenitis.  He also  had some intraepithelial lymphocytosis and did not obtain celiac serologies as had been described in the results letter.  We will plan to try and get those labs ordered today if we cannot get them done today we will get them when he comes in for his EGD/EUS with possible EMR in the coming weeks.  I do not anticipate a pancreas biopsy.  The risks of EUS including bleeding, infection, aspiration pneumonia and intestinal perforation were discussed as was the possibility it may not give a definitive diagnosis.  If a biopsy of the pancreas is done as part of the EUS, there is an additional risk of pancreatitis at the rate of about 1%.  It was explained that procedure related pancreatitis is typically mild, although can be severe and even life threatening, which is why we do not perform random pancreatic biopsies and only biopsy a lesion we feel is concerning enough to warrant the risk.  The risks and benefits of endoscopic evaluation were discussed with the patient; these include but are not limited to the risk of perforation, infection, bleeding, missed lesions, lack of diagnosis, severe illness requiring hospitalization, as well as anesthesia and sedation related illnesses.  The patient is agreeable to proceed.  All patient questions were answered, to the best of my ability, and the patient agrees to the aforementioned plan of action with follow-up as indicated.   PLAN  Plan to proceed with EUS/EGD with possible EMR Preprocedure laboratories as outlined below Continue PPI therapy at current dosing and will adjust after follow-up EGD is performed   Orders Placed This Encounter  Procedures  . Procedural/ Surgical Case Request: UPPER ENDOSCOPIC ULTRASOUND (EUS) RADIAL, ENDOSCOPIC MUCOSAL RESECTION  . Basic Metabolic Panel (BMET)  . INR/PT  . CBC  . Ambulatory referral to Gastroenterology    New Prescriptions   No medications on file   Modified Medications   No medications on file    Planned Follow  Up No follow-ups on file.   Justice Britain, MD [facility] Advanced Endoscopy Office # [id number]

## 2019-05-04 ENCOUNTER — Other Ambulatory Visit (INDEPENDENT_AMBULATORY_CARE_PROVIDER_SITE_OTHER)

## 2019-05-04 DIAGNOSIS — K3189 Other diseases of stomach and duodenum: Secondary | ICD-10-CM

## 2019-05-04 LAB — TSH: TSH: 1.49 u[IU]/mL (ref 0.35–4.50)

## 2019-05-04 LAB — IGA: IgA: 248 mg/dL (ref 68–378)

## 2019-05-11 ENCOUNTER — Other Ambulatory Visit (HOSPITAL_COMMUNITY)
Admission: RE | Admit: 2019-05-11 | Discharge: 2019-05-11 | Disposition: A | Discharge status: Home / Self Care | Source: Ambulatory Visit | Attending: Gastroenterology | Admitting: Gastroenterology

## 2019-05-11 DIAGNOSIS — Z20822 Contact with and (suspected) exposure to covid-19: Secondary | ICD-10-CM | POA: Diagnosis not present

## 2019-05-11 DIAGNOSIS — Z01812 Encounter for preprocedural laboratory examination: Secondary | ICD-10-CM | POA: Insufficient documentation

## 2019-05-12 LAB — NOVEL CORONAVIRUS, NAA (HOSP ORDER, SEND-OUT TO REF LAB; TAT 18-24 HRS): SARS-CoV-2, NAA: NOT DETECTED

## 2019-05-13 ENCOUNTER — Encounter (HOSPITAL_COMMUNITY): Payer: Self-pay | Admitting: Gastroenterology

## 2019-05-13 ENCOUNTER — Other Ambulatory Visit: Payer: Self-pay

## 2019-05-13 NOTE — Anesthesia Preprocedure Evaluation (Addendum)
Anesthesia Evaluation  Patient identified by MRN, date of birth, ID band Patient awake    Reviewed: Allergy & Precautions, NPO status , Patient's Chart, lab work & pertinent test results  History of Anesthesia Complications (+) POST - OP SPINAL HEADACHE and history of anesthetic complications  Airway Mallampati: I       Dental  (+) Teeth Intact, Poor Dentition   Pulmonary sleep apnea and Continuous Positive Airway Pressure Ventilation , Current Smoker and Patient abstained from smoking.,    Pulmonary exam normal breath sounds clear to auscultation       Cardiovascular hypertension, Pt. on medications and Pt. on home beta blockers Normal cardiovascular exam Rhythm:Regular Rate:Normal     Neuro/Psych  Headaches, Seizures -,  PSYCHIATRIC DISORDERS Depression Dementia    GI/Hepatic GERD  Medicated and Controlled,  Endo/Other  diabetes  Renal/GU Renal diseaseCKD III     Musculoskeletal   Abdominal Normal abdominal exam  (+)   Peds  Hematology  (+) HIV,   Anesthesia Other Findings   Reproductive/Obstetrics                            Anesthesia Physical Anesthesia Plan  ASA: III  Anesthesia Plan: MAC   Post-op Pain Management:    Induction:   PONV Risk Score and Plan: Propofol infusion  Airway Management Planned: Natural Airway and Mask  Additional Equipment: None  Intra-op Plan:   Post-operative Plan:   Informed Consent: I have reviewed the patients History and Physical, chart, labs and discussed the procedure including the risks, benefits and alternatives for the proposed anesthesia with the patient or authorized representative who has indicated his/her understanding and acceptance.     Dental advisory given  Plan Discussed with: CRNA  Anesthesia Plan Comments: (See PAT note by Karoline Caldwell, PA-C )       Anesthesia Quick Evaluation

## 2019-05-13 NOTE — Progress Notes (Signed)
Pt denies any acute cardiopulmonary issues. Pt stated that he sees a cardiologist at the [facility] in [place] and PCP is Dr. Lacinda Axon at [facility]. [place] stated that an echo and stress test were both performed at the [facility] . Pt stated that a cardiac cath was performed at [facility]; records requested, awaiting response. Pt stated that a chest x ray was done at [facility]. Pt denies recent labs. Pt made aware to stop taking vitamins, fish oil and herbal medications. Do not take any NSAIDs ie: Ibuprofen, Advil, Naproxen (Aleve), Motrin, BC and Goody Powder. Pt made aware to take 20 units of Lantus insulin at HS. Pt made aware to check CBG every 2 hours prior to arrival to hospital on DOS. Pt made aware to take 1/2 correction dose of Novolog SS insulin for a CBG > 220.  Pt made aware to treat a CBG < 70 with 4 ounces of apple juice, wait 15 minutes after intervention to recheck BG, if BG remains < 70, call Short Stay unit to speak with a nurse. Pt reminded to continue to quarantine. Pt verbalized understanding of all pre-op instructions. PA, Anesthesiology, asked to review pt cardiac history.

## 2019-05-13 NOTE — Progress Notes (Signed)
Called to remind patient to quarantine until procedure.  Patient states no symptoms of Covid.

## 2019-05-13 NOTE — Progress Notes (Addendum)
Case: [id number] Date/Time: 05/14/19 0900   Procedures:      UPPER ENDOSCOPIC ULTRASOUND (EUS) RADIAL (N/A )     ENDOSCOPIC MUCOSAL RESECTION (N/A )   Anesthesia type: Monitor Anesthesia Care   Pre-op diagnosis: Submucosal Gastric Lesion, Gastric Nodule   Location: [facility] ROOM 1 / [facility]   Surgeons: Mansouraty, Telford Nab., MD      DISCUSSION: Same day workup  Follows at [facility] for medical care. Per note scanned under Media tab 10/31/18, pt has has history of HIV followed by ID and is adherent to medication regimen, CKD followed by nephrology, seizure disorder followed by neurology maintained on keppra and gabapentin, IDDMII with history of medication noncompliance. There is an unclear history of CAD. Per records in [place] pr reports history of CAD with multiple MI and possible PCI in 2007 done in [facility] per pt. Patient states he sees cardiologist at the [place], however we have no records for review. [facility] Don[facility] Rom[facility] office do not include any cardiology testing/records. EKG from 12/02/18 shows Normal sinus rhythm. Rate 97. Possible Anterior infarct , age undetermined.  Per records from the [place], pt was referred for endoscopy under MAC because he did not tolerate conscious sedation and procedure had to be aborted (combative, pulling tube out, oxygen desaturation).  CBC from 04/30/19 WNL. BMP same date notable for creatinine 1.99. Review of available labs in Epic shows this to be near his baseline.   Reviewed case with Dr. Gifford Shave due to unclear cardiac history. He advised pt will be evaluated on DOS.  VS: There were no vitals taken for this visit.  PROVIDERS: [place]: Will need DOS labs (all labs ordered are listed, but only abnormal results are displayed)  Labs Reviewed - No data to display    EKG: 12/02/18: Normal sinus rhythm. Rate 97. Possible Anterior infarct , age undetermined  CV: N/A  Past Medical History:  Diagnosis Date  . Adjustment  disorder with depressed mood   . Alcohol dependence ([place])   . Allergic rhinitis   . Allergies    " climactic"  . Anal fissure   . Anemia   . Arthritis   . 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 CAD (coronary artery disease)   . Cannabis dependence ([place])   . Carpal tunnel syndrome   . Chronic kidney disease, stage 3, mod decreased GFR   . Cocaine dependence ([place])   . Colon polyps   . Complication of anesthesia    per pt, had uncontrollable movement during sedation./last procedure imcomplete  . Dementia ([place])   . Depression   . Diabetes ([place])   . Gastric lesion    submucosal gastric lesion/gastric nodule  . GERD (gastroesophageal reflux disease)   . Heart attack ([place])    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  . HIV (human immunodeficiency virus infection) ([place])   . Hypertension   . Peripheral neuropathy   . Renal failure   . Seizure ([place])    last  . Sleep apnea    have a cpap but use it when he is "Heavy"  . Spinal headache   . Wears glasses     Past Surgical History:  Procedure Laterality Date  . CARDIAC CATHETERIZATION    . COLONOSCOPY     [place], [place] at the [place]. First one done at 31. Recently had one done (2019 or 2020) and wasn't completed  . CORONARY  [place], [facility] at the [place]. Thinks it was done 2020 it was incomplete    MEDICATIONS: No current facility-administered medications for this encounter.   Marland Kitchen amLODipine (NORVASC) 10 MG tablet  . aspirin EC 81 MG tablet  . bictegravir-emtricitabine-tenofovir AF (BIKTARVY) 50-200-25 MG TABS tablet  . carvedilol (COREG) 12.5 MG tablet  . cholecalciferol (VITAMIN D) 25 MCG (1000 UT) tablet  . cromolyn (NASALCROM) 5.2 MG/ACT nasal spray  . diclofenac sodium (VOLTAREN) 1 % GEL  . diphenhydrAMINE (BENADRYL) 25 mg capsule  . ferrous sulfate 325 (65 FE) MG tablet  . gabapentin (NEURONTIN) 300 MG capsule  . hydrochlorothiazide (HYDRODIURIL) 25 MG tablet  . insulin  aspart (NOVOLOG) 100 unit/mL injection  . isosorbide mononitrate (IMDUR) 60 MG 24 hr tablet  . LANTUS SOLOSTAR 100 UNIT/ML Solostar Pen  . levETIRAcetam (KEPPRA) 750 MG tablet  . lisinopril (ZESTRIL) 40 MG tablet  . omeprazole (PRILOSEC) 20 MG capsule  . potassium chloride (MICRO-K) 10 MEQ CR capsule  . REFRESH 1.4-0.6 % SOLN  . rosuvastatin (CRESTOR) 40 MG tablet  . tamsulosin (FLOMAX) 0.4 MG CAPS capsule  . traZODone (DESYREL) 100 MG tablet  . venlafaxine XR (EFFEXOR-XR) 37.5 MG 24 hr capsule  . omeprazole (PRILOSEC) 40 MG capsule    Wynonia Musty [facility] Short Stay Center/Anesthesiology Phone [phone] 05/13/2019 3:45 PM

## 2019-05-14 ENCOUNTER — Ambulatory Visit (HOSPITAL_COMMUNITY): Admitting: Physician Assistant

## 2019-05-14 ENCOUNTER — Encounter (HOSPITAL_COMMUNITY)
Admission: RE | Disposition: A | Discharge status: Home / Self Care | Payer: Self-pay | Source: Home / Self Care | Attending: Gastroenterology

## 2019-05-14 ENCOUNTER — Ambulatory Visit (HOSPITAL_COMMUNITY)
Admission: RE | Admit: 2019-05-14 | Discharge: 2019-05-14 | Disposition: A | Discharge status: Home / Self Care | Attending: Gastroenterology | Admitting: Gastroenterology

## 2019-05-14 ENCOUNTER — Encounter (HOSPITAL_COMMUNITY): Payer: Self-pay | Admitting: Gastroenterology

## 2019-05-14 DIAGNOSIS — F17210 Nicotine dependence, cigarettes, uncomplicated: Secondary | ICD-10-CM | POA: Insufficient documentation

## 2019-05-14 DIAGNOSIS — F0390 Unspecified dementia without behavioral disturbance: Secondary | ICD-10-CM | POA: Insufficient documentation

## 2019-05-14 DIAGNOSIS — I252 Old myocardial infarction: Secondary | ICD-10-CM | POA: Diagnosis not present

## 2019-05-14 DIAGNOSIS — I2510 Atherosclerotic heart disease of native coronary artery without angina pectoris: Secondary | ICD-10-CM | POA: Insufficient documentation

## 2019-05-14 DIAGNOSIS — K449 Diaphragmatic hernia without obstruction or gangrene: Secondary | ICD-10-CM | POA: Diagnosis not present

## 2019-05-14 DIAGNOSIS — F329 Major depressive disorder, single episode, unspecified: Secondary | ICD-10-CM | POA: Diagnosis not present

## 2019-05-14 DIAGNOSIS — I129 Hypertensive chronic kidney disease with stage 1 through stage 4 chronic kidney disease, or unspecified chronic kidney disease: Secondary | ICD-10-CM | POA: Insufficient documentation

## 2019-05-14 DIAGNOSIS — G4733 Obstructive sleep apnea (adult) (pediatric): Secondary | ICD-10-CM | POA: Insufficient documentation

## 2019-05-14 DIAGNOSIS — K228 Other specified diseases of esophagus: Secondary | ICD-10-CM

## 2019-05-14 DIAGNOSIS — Z21 Asymptomatic human immunodeficiency virus [HIV] infection status: Secondary | ICD-10-CM | POA: Insufficient documentation

## 2019-05-14 DIAGNOSIS — F4321 Adjustment disorder with depressed mood: Secondary | ICD-10-CM | POA: Diagnosis not present

## 2019-05-14 DIAGNOSIS — Z794 Long term (current) use of insulin: Secondary | ICD-10-CM | POA: Diagnosis not present

## 2019-05-14 DIAGNOSIS — Z7982 Long term (current) use of aspirin: Secondary | ICD-10-CM | POA: Insufficient documentation

## 2019-05-14 DIAGNOSIS — K319 Disease of stomach and duodenum, unspecified: Secondary | ICD-10-CM

## 2019-05-14 DIAGNOSIS — N189 Chronic kidney disease, unspecified: Secondary | ICD-10-CM | POA: Insufficient documentation

## 2019-05-14 DIAGNOSIS — I899 Noninfective disorder of lymphatic vessels and lymph nodes, unspecified: Secondary | ICD-10-CM | POA: Diagnosis not present

## 2019-05-14 DIAGNOSIS — Z955 Presence of coronary angioplasty implant and graft: Secondary | ICD-10-CM | POA: Diagnosis not present

## 2019-05-14 DIAGNOSIS — K2100 Gastro-esophageal reflux disease with esophagitis, without bleeding: Secondary | ICD-10-CM | POA: Diagnosis not present

## 2019-05-14 DIAGNOSIS — K3189 Other diseases of stomach and duodenum: Secondary | ICD-10-CM | POA: Diagnosis not present

## 2019-05-14 DIAGNOSIS — E1122 Type 2 diabetes mellitus with diabetic chronic kidney disease: Secondary | ICD-10-CM | POA: Insufficient documentation

## 2019-05-14 DIAGNOSIS — Z79899 Other long term (current) drug therapy: Secondary | ICD-10-CM | POA: Insufficient documentation

## 2019-05-14 DIAGNOSIS — G40909 Epilepsy, unspecified, not intractable, without status epilepticus: Secondary | ICD-10-CM | POA: Insufficient documentation

## 2019-05-14 HISTORY — DX: Other reaction to spinal and lumbar puncture: G97.1

## 2019-05-14 HISTORY — DX: Allergy, unspecified, initial encounter: T78.40XA

## 2019-05-14 HISTORY — PX: ESOPHAGOGASTRODUODENOSCOPY (EGD) WITH PROPOFOL: SHX5813

## 2019-05-14 HISTORY — DX: Disease of stomach and duodenum, unspecified: K31.9

## 2019-05-14 HISTORY — DX: Presence of spectacles and contact lenses: Z97.3

## 2019-05-14 HISTORY — PX: EUS: SHX5427

## 2019-05-14 HISTORY — DX: Polyneuropathy, unspecified: G62.9

## 2019-05-14 LAB — GLUCOSE, CAPILLARY
Glucose-Capillary: 141 mg/dL — ABNORMAL HIGH (ref 70–99)
Glucose-Capillary: 147 mg/dL — ABNORMAL HIGH (ref 70–99)

## 2019-05-14 SURGERY — UPPER ENDOSCOPIC ULTRASOUND (EUS) RADIAL
Anesthesia: Monitor Anesthesia Care

## 2019-05-14 MED ORDER — PROPOFOL 500 MG/50ML IV EMUL
INTRAVENOUS | Status: DC | PRN
  Administered 2019-05-14: 125 ug/kg/min via INTRAVENOUS

## 2019-05-14 MED ORDER — LACTATED RINGERS IV SOLN: INTRAVENOUS | Status: DC

## 2019-05-14 MED ORDER — GLYCOPYRROLATE 0.2 MG/ML IJ SOLN
INTRAMUSCULAR | Status: DC | PRN
  Administered 2019-05-14: .1 mg via INTRAVENOUS

## 2019-05-14 MED ORDER — PROPOFOL 10 MG/ML IV BOLUS
INTRAVENOUS | Status: DC | PRN
  Administered 2019-05-14: 100 mg via INTRAVENOUS

## 2019-05-14 MED ORDER — SODIUM CHLORIDE 0.9 % IV SOLN: INTRAVENOUS | Status: DC

## 2019-05-14 MED ORDER — OMEPRAZOLE 20 MG PO CPDR: 20.0000 mg | DELAYED_RELEASE_CAPSULE | Freq: Two times a day (BID) | ORAL | 3 refills | Status: AC

## 2019-05-14 NOTE — Op Note (Addendum)
[facility] Patient Name: Harold Davenport Procedure Date : 05/14/2019 MRN: [id number] Attending MD: Justice Britain , MD Date of Birth: 11/19/1960 CSN: [id number] Age: 59 Admit Type: Inpatient Procedure:                Upper EUS Indications:              Submucosal tumor versus extrinsic mass found on                            endoscopy Providers:                Justice Britain, MD, Vista Lawman, RN, Lazaro Arms, Technician Referring MD:             Gastroenterology from the [facility] Medicines:                Monitored Anesthesia Care Complications:            No immediate complications. Estimated Blood Loss:     Estimated blood loss: none. Procedure:                Pre-Anesthesia Assessment:                           - Prior to the procedure, a History and Physical                            was performed, and patient medications and                            allergies were reviewed. The patient's tolerance of                            previous anesthesia was also reviewed. The risks                            and benefits of the procedure and the sedation                            options and risks were discussed with the patient.                            All questions were answered, and informed consent                            was obtained. Prior Anticoagulants: The patient has                            taken no previous anticoagulant or antiplatelet                            agents except for aspirin. ASA Grade Assessment: II                            -  A patient with mild systemic disease. After                            reviewing the risks and benefits, the patient was                            deemed in satisfactory condition to undergo the                            procedure.                           After obtaining informed consent, the endoscope was                            passed under direct vision. Throughout the                             procedure, the patient's blood pressure, pulse, and                            oxygen saturations were monitored continuously. The                            [id number] ([id number]) Olympus therapeutic                            gastroscope was introduced through the mouth, and                            advanced to the second part of duodenum. The                            [id number] ([id number]) Olympus Radial EUS scope was                            introduced through the mouth, and advanced to the                            stomach for ultrasound examination. The upper EUS                            was accomplished without difficulty. The patient                            tolerated the procedure. Scope In: Scope Out: Findings:      ENDOSCOPIC FINDING: :      No gross lesions were noted in the entire esophagus. Previous       esophagitis has healed.      The Z-line was irregular and was found 41 cm from the incisors.      A small hiatal hernia was present.      Patchy mildly erythematous mucosa without bleeding was found in the       gastric antrum. Previously biopsied and negative for H. pylori.  A small scar was found in the gastric antrum in the region of the       presumed prior subepithelial lesion. This was not as consistent with a       subepithelial lesion today.      No other gross lesions were noted in the entire examined stomach.      No gross lesions were noted in the duodenal bulb, in the first portion       of the duodenum and in the second portion of the duodenum.      ENDOSONOGRAPHIC FINDING: :      Endosonographic imaging in the entire stomach showed no intramural       (subepithelial) lesion or wall thickening.      No malignant-appearing lymph nodes were visualized in the celiac region       (level 20) and perigastric region.      The celiac region was visualized. Impression:               EGD Impression:                            - No gross lesions in esophagus.                           - Z-line irregular, 41 cm from the incisors.                           - Small hiatal hernia.                           - Erythematous mucosa in the antrum.                           - Scar in the gastric antrum.                           - No gross lesions in the stomach.                           - No gross lesions in the duodenal bulb, in the                            first portion of the duodenum and in the second                            portion of the duodenum.                           EUS Impression:                           - No evidence of significant subepithelial lesion                            noted or intramural lesion noted on EUS. Extensive                            look at region  of previous scar site.                           - No malignant-appearing lymph nodes were                            visualized in the celiac region (level 20) and                            perigastric region.                           -Decision due to just scar being noted currently,                            size of scar, and no overt EUS imaging to suggest                            an overt intramural lesion, we did not pursue an                            EMR of the area to decrease risk of potential                            complications. Recommendation:           - The patient will be observed post-procedure,                            until all discharge criteria are met.                           - Discharge patient to home.                           - Patient has a contact number available for                            emergencies. The signs and symptoms of potential                            delayed complications were discussed with the                            patient. Return to normal activities tomorrow.                            Written discharge instructions were provided to the                             patient.                           - Restart Omeprazole 20 mg twice daily x 72-month  and then can decrease to 20 mg daily.                           - Recommend repeat EGD/EUS in 1-year to re-evaluate                            the region, but likely monitoring/surveillance                            periodically will be the plan.                           - This can be done at the [place] or can be done with [place]                            here.                           - This information will be sent to the [place].                           - The findings and recommendations were discussed                            with the patient.                           - The findings and recommendations were discussed                            with the patient's family. Procedure Code(s):        --- Professional ---                           [phone], Esophagogastroduodenoscopy, flexible,                            transoral; with endoscopic ultrasound examination                            limited to the esophagus, stomach or duodenum, and                            adjacent structures Diagnosis Code(s):        --- Professional ---                           K22.8, Other specified diseases of esophagus                           K44.9, Diaphragmatic hernia without obstruction or                            gangrene  K31.89, Other diseases of stomach and duodenum                           I89.9, Noninfective disorder of lymphatic vessels                            and lymph nodes, unspecified                           K92.9, Disease of digestive system, unspecified CPT copyright 2019 [employer]. All rights reserved. The codes documented in this report are preliminary and upon coder review may  be revised to meet current compliance requirements. Justice Britain, MD 05/14/2019 10:17:04 AM Number of Addenda: 0

## 2019-05-14 NOTE — Transfer of Care (Signed)
Immediate Anesthesia Transfer of Care Note  Patient: Harold Davenport  Procedure(s) Performed: UPPER ENDOSCOPIC ULTRASOUND (EUS) RADIAL (N/A ) ESOPHAGOGASTRODUODENOSCOPY (EGD) WITH PROPOFOL (N/A )  Patient Location: PACU  Anesthesia Type:MAC  Level of Consciousness: drowsy and patient cooperative  Airway & Oxygen Therapy: Patient Spontanous Breathing  Post-op Assessment: Report given to RN and Post -op Vital signs reviewed and stable  Post vital signs: Reviewed and stable  Last Vitals:  Vitals Value Taken Time  BP 113/81 05/14/19 0953  Temp    Pulse    Resp 16 05/14/19 0954  SpO2    Vitals shown include unvalidated device data.  Last Pain:  Vitals:   05/14/19 0747  TempSrc: Temporal  PainSc: 0-No pain         Complications: No apparent anesthesia complications

## 2019-05-14 NOTE — Interval H&P Note (Signed)
History and Physical Interval Note:  05/14/2019 7:36 AM  Purcell Nails  has presented today for surgery, with the diagnosis of Submucosal Gastric Lesion, Gastric Nodule.  The various methods of treatment have been discussed with the patient and family. After consideration of risks, benefits and other options for treatment, the patient has consented to  Procedure(s): UPPER ENDOSCOPIC ULTRASOUND (EUS) RADIAL (N/A) ENDOSCOPIC MUCOSAL RESECTION (N/A) as a surgical intervention.  The patient's history has been reviewed, patient examined, no change in status, stable for surgery.  I have reviewed the patient's chart and labs.  Questions were answered to the patient's satisfaction.    The risks of EUS including bleeding, infection, aspiration pneumonia and intestinal perforation were discussed as was the possibility it may not give a definitive diagnosis.  If a biopsy of the pancreas is done as part of the EUS, there is an additional risk of pancreatitis at the rate of about 1%.  It was explained that procedure related pancreatitis is typically mild, although can be severe and even life threatening, which is why we do not perform random pancreatic biopsies and only biopsy a lesion we feel is concerning enough to warrant the risk.    [employer]

## 2019-05-14 NOTE — Anesthesia Postprocedure Evaluation (Signed)
Anesthesia Post Note  Patient: Harold Davenport  Procedure(s) Performed: UPPER ENDOSCOPIC ULTRASOUND (EUS) RADIAL (N/A ) ESOPHAGOGASTRODUODENOSCOPY (EGD) WITH PROPOFOL (N/A )     Patient location during evaluation: PACU Anesthesia Type: MAC Level of consciousness: awake Pain management: pain level controlled Vital Signs Assessment: post-procedure vital signs reviewed and stable Respiratory status: spontaneous breathing Cardiovascular status: stable Postop Assessment: no apparent nausea or vomiting Anesthetic complications: no    Last Vitals:  Vitals:   05/14/19 1015 05/14/19 1023  BP:  135/80  Pulse:    Resp: 13 19  Temp:    SpO2: 97%     Last Pain:  Vitals:   05/14/19 0747  TempSrc: Temporal  PainSc: 0-No pain   Pain Goal:                   Huston Foley

## 2020-01-21 ENCOUNTER — Other Ambulatory Visit: Payer: Self-pay

## 2020-01-21 ENCOUNTER — Encounter (HOSPITAL_COMMUNITY): Payer: Self-pay | Admitting: Emergency Medicine

## 2020-01-21 ENCOUNTER — Emergency Department (HOSPITAL_COMMUNITY): Payer: No Typology Code available for payment source

## 2020-01-21 ENCOUNTER — Emergency Department (HOSPITAL_COMMUNITY)
Admission: EM | Admit: 2020-01-21 | Discharge: 2020-01-21 | Disposition: A | Discharge status: Home / Self Care | Payer: No Typology Code available for payment source | Attending: Emergency Medicine | Admitting: Emergency Medicine

## 2020-01-21 DIAGNOSIS — Z7982 Long term (current) use of aspirin: Secondary | ICD-10-CM | POA: Insufficient documentation

## 2020-01-21 DIAGNOSIS — N183 Chronic kidney disease, stage 3 unspecified: Secondary | ICD-10-CM | POA: Diagnosis not present

## 2020-01-21 DIAGNOSIS — R519 Headache, unspecified: Secondary | ICD-10-CM | POA: Insufficient documentation

## 2020-01-21 DIAGNOSIS — E119 Type 2 diabetes mellitus without complications: Secondary | ICD-10-CM | POA: Insufficient documentation

## 2020-01-21 DIAGNOSIS — I1310 Hypertensive heart and chronic kidney disease without heart failure, with stage 1 through stage 4 chronic kidney disease, or unspecified chronic kidney disease: Secondary | ICD-10-CM | POA: Insufficient documentation

## 2020-01-21 DIAGNOSIS — B20 Human immunodeficiency virus [HIV] disease: Secondary | ICD-10-CM | POA: Insufficient documentation

## 2020-01-21 DIAGNOSIS — I2510 Atherosclerotic heart disease of native coronary artery without angina pectoris: Secondary | ICD-10-CM | POA: Diagnosis not present

## 2020-01-21 DIAGNOSIS — F17200 Nicotine dependence, unspecified, uncomplicated: Secondary | ICD-10-CM | POA: Diagnosis not present

## 2020-01-21 DIAGNOSIS — F0390 Unspecified dementia without behavioral disturbance: Secondary | ICD-10-CM | POA: Insufficient documentation

## 2020-01-21 DIAGNOSIS — Z79899 Other long term (current) drug therapy: Secondary | ICD-10-CM | POA: Diagnosis not present

## 2020-01-21 DIAGNOSIS — I10 Essential (primary) hypertension: Secondary | ICD-10-CM

## 2020-01-21 LAB — PROTIME-INR
INR: 1 (ref 0.8–1.2)
Prothrombin Time: 13.2 seconds (ref 11.4–15.2)

## 2020-01-21 LAB — DIFFERENTIAL
Abs Immature Granulocytes: 0.03 10*3/uL (ref 0.00–0.07)
Basophils Absolute: 0 10*3/uL (ref 0.0–0.1)
Basophils Relative: 0 %
Eosinophils Absolute: 0.1 10*3/uL (ref 0.0–0.5)
Eosinophils Relative: 2 %
Immature Granulocytes: 0 %
Lymphocytes Relative: 28 %
Lymphs Abs: 2.3 10*3/uL (ref 0.7–4.0)
Monocytes Absolute: 0.6 10*3/uL (ref 0.1–1.0)
Monocytes Relative: 7 %
Neutro Abs: 5 10*3/uL (ref 1.7–7.7)
Neutrophils Relative %: 63 %

## 2020-01-21 LAB — CBC
HCT: 36 % — ABNORMAL LOW (ref 39.0–52.0)
Hemoglobin: 12 g/dL — ABNORMAL LOW (ref 13.0–17.0)
MCH: 32.7 pg (ref 26.0–34.0)
MCHC: 33.3 g/dL (ref 30.0–36.0)
MCV: 98.1 fL (ref 80.0–100.0)
Platelets: 245 10*3/uL (ref 150–400)
RBC: 3.67 MIL/uL — ABNORMAL LOW (ref 4.22–5.81)
RDW: 13.9 % (ref 11.5–15.5)
WBC: 8 10*3/uL (ref 4.0–10.5)
nRBC: 0 % (ref 0.0–0.2)

## 2020-01-21 LAB — COMPREHENSIVE METABOLIC PANEL
ALT: 19 U/L (ref 0–44)
AST: 23 U/L (ref 15–41)
Albumin: 3.7 g/dL (ref 3.5–5.0)
Alkaline Phosphatase: 48 U/L (ref 38–126)
Anion gap: 10 (ref 5–15)
BUN: 14 mg/dL (ref 6–20)
CO2: 25 mmol/L (ref 22–32)
Calcium: 9.2 mg/dL (ref 8.9–10.3)
Chloride: 103 mmol/L (ref 98–111)
Creatinine, Ser: 1.9 mg/dL — ABNORMAL HIGH (ref 0.61–1.24)
GFR calc Af Amer: 44 mL/min — ABNORMAL LOW (ref >60)
GFR calc non Af Amer: 38 mL/min — ABNORMAL LOW (ref >60)
Glucose, Bld: 259 mg/dL — ABNORMAL HIGH (ref 70–99)
Potassium: 3.2 mmol/L — ABNORMAL LOW (ref 3.5–5.1)
Sodium: 138 mmol/L (ref 135–145)
Total Bilirubin: 0.3 mg/dL (ref 0.3–1.2)
Total Protein: 6.6 g/dL (ref 6.5–8.1)

## 2020-01-21 LAB — APTT: aPTT: 29 seconds (ref 24–36)

## 2020-01-21 MED ORDER — METOCLOPRAMIDE HCL 10 MG PO TABS
10.0000 mg | ORAL_TABLET | Freq: Once | ORAL | Status: AC
  Administered 2020-01-21: 10 mg via ORAL
  Filled 2020-01-21: qty 1

## 2020-01-21 MED ORDER — CARVEDILOL 3.125 MG PO TABS
12.5000 mg | ORAL_TABLET | Freq: Two times a day (BID) | ORAL | Status: DC
  Administered 2020-01-21: 12.5 mg via ORAL
  Filled 2020-01-21: qty 4

## 2020-01-21 MED ORDER — ACETAMINOPHEN 500 MG PO TABS
1000.0000 mg | ORAL_TABLET | Freq: Once | ORAL | Status: AC
  Administered 2020-01-21: 1000 mg via ORAL
  Filled 2020-01-21: qty 2

## 2020-01-21 MED ORDER — SODIUM CHLORIDE 0.9% FLUSH: 3.0000 mL | Freq: Once | INTRAVENOUS | Status: DC

## 2020-01-21 NOTE — ED Triage Notes (Signed)
Pt reports BP elevated at home, L sided headache, and L arm weakness since waking up this morning.  LKW last night when he went to bed.  No arm drift.  Reports multiple falls due to intermittent L leg weakness over the past month.

## 2020-01-21 NOTE — Discharge Instructions (Addendum)
Please call your PCP tomorrow to schedule an appointment as soon as possible. You will likely need to adjust your blood pressure medications. In the meantime, please return to the emergency department if you develop any concerning symptoms, including inability to use your limbs, severe headaches, changes in vision, or severe chest pain.

## 2020-01-21 NOTE — ED Provider Notes (Signed)
[facility] EMERGENCY DEPARTMENT Provider Note   CSN: [id number] Arrival date & time: 01/21/20  1321     History Chief Complaint  Patient presents with  . Headache  . Weakness    Harold Davenport is a 59 y.o. male.  He presents with left sided headache and weakness and headache today. Patient states he always gets these symptoms when his BP is elevated. He woke at 4:45AM with a left sided headache, BP 180s/110s. He has been having BP "spikes" multiple times a day recently. States it always starts with left sided headache and eye pain followed by left sided weakness. Denies current chest pain or shortness of breath, but has some yesterday. Denies visual disturbance, nausea, vomiting, decreased urine.   He takes multiple medications for his BP, all at the same time in the morning. He gets his care at the [facility]. He call this morning with his symptoms, and was instructed to come to the ED.   Denies history of stroke.    Hypertension This is a chronic problem. The problem occurs constantly. The problem has been gradually worsening. Associated symptoms include headaches. Pertinent negatives include no chest pain, no abdominal pain and no shortness of breath. Nothing aggravates the symptoms. Nothing relieves the symptoms. The treatment provided mild relief.       Past Medical History:  Diagnosis Date  . Adjustment disorder with depressed mood   . Alcohol dependence ([place])   . Allergic rhinitis   . Allergies    " climactic"  . Anal fissure   . Anemia   . Arthritis   . 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 CAD (coronary artery disease)   . Cannabis dependence ([place])   . Carpal tunnel syndrome   . Chronic kidney disease, stage 3, mod decreased GFR   . Cocaine dependence ([place])   . Colon polyps   . Complication of anesthesia    per pt, had uncontrollable movement during sedation./last procedure imcomplete  . Dementia ([place])   . Depression   .  Diabetes ([place])   . Gastric lesion    submucosal gastric lesion/gastric nodule  . GERD (gastroesophageal reflux disease)   . Heart attack ([place])    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  . HIV (human immunodeficiency virus infection) ([place])   . Hypertension   . Peripheral neuropathy   . Renal failure   . Seizure ([place])    last  . Sleep apnea    have a cpap but use it when he is "Heavy"  . Spinal headache   . Wears glasses     Patient Active Problem List   Diagnosis Date Noted  . Lymphocytosis 04/30/2019  . Subepithelial gastric lesion 04/30/2019  . Duodenitis 04/30/2019  . Esophagitis 04/30/2019  . Sleep apnea   . Seizure ([place])   . Hypertension   . HIV (human immunodeficiency virus infection) ([place])   . Heart attack ([place])   . GERD (gastroesophageal reflux disease)   . Diabetes ([place])   . Dementia ([place])   . Colon polyps   . Chronic kidney disease, stage 3, mod decreased GFR   . CAD (coronary artery disease)   . Anemia     Past Surgical History:  Procedure Laterality Date  . CARDIAC CATHETERIZATION    . COLONOSCOPY     [place], [place] at the [facility]. First one done at 26. Recently had one done (2019 or 2020) and wasn't completed  . CORONARY  [place], [place] at the [facility]. Thinks it was done 2020 it was incomplete  . ESOPHAGOGASTRODUODENOSCOPY (EGD) WITH PROPOFOL N/A 05/14/2019   Procedure: ESOPHAGOGASTRODUODENOSCOPY (EGD) WITH PROPOFOL;  Surgeon: Rush Landmark Telford Nab., MD;  Location: [facility];  Service: Gastroenterology;  Laterality: N/A;  . EUS N/A 05/14/2019   Procedure: UPPER ENDOSCOPIC ULTRASOUND (EUS) RADIAL;  Surgeon: Rush Landmark Telford Nab., MD;  Location: [facility];  Service: Gastroenterology;  Laterality: N/A;       Family History  Problem Relation Age of Onset  . Diabetes Mother   . Diabetes Father   . Diabetes Sister        has 4 sisters  . Diabetes Brother   . Diabetes Brother   . Diabetes Brother   . Colon cancer Neg Hx    . Esophageal cancer Neg Hx   . Rectal cancer Neg Hx   . Stomach cancer Neg Hx     Social History   Tobacco Use  . Smoking status: Current Some Day Smoker  . Smokeless tobacco: Never Used  . Tobacco comment: smokes a pack a week  Vaping Use  . Vaping Use: Never used  Substance Use Topics  . Alcohol use: Yes    Comment: rare  . Drug use: Yes    Types: Marijuana    Comment:  4 times a week    Home Medications Prior to Admission medications   Medication Sig Start Date End Date Taking? Authorizing Provider  ALLERGY RELIEF, LORATADINE, 10 MG tablet Take 10 mg by mouth daily.  01/05/20  Yes [provider]  amLODipine (NORVASC) 10 MG tablet Take 10 mg by mouth daily.   Yes [provider]  aspirin EC 81 MG tablet Take 81 mg by mouth daily.   Yes [provider]  bictegravir-emtricitabine-tenofovir AF (BIKTARVY) 50-200-25 MG TABS tablet Take 1 tablet by mouth daily.   Yes [provider]  carvedilol (COREG) 12.5 MG tablet Take 12.5 mg by mouth daily.    Yes [provider]  cholecalciferol (VITAMIN D) 25 MCG (1000 UT) tablet Take 1,000 Units by mouth daily.  12/22/18  Yes [provider]  diclofenac sodium (VOLTAREN) 1 % GEL Apply 2 g topically 4 (four) times daily as needed (pain).    Yes [provider]  diphenhydrAMINE (BENADRYL) 25 mg capsule Take 25 mg by mouth every 6 (six) hours as needed for itching or allergies.    Yes [provider]  fluticasone (FLONASE) 50 MCG/ACT nasal spray Place 1 spray into both nostrils 2 (two) times daily as needed for allergies.  01/05/20  Yes [provider]  hydrochlorothiazide (HYDRODIURIL) 25 MG tablet Take 25 mg by mouth every morning. 11/28/18  Yes [provider]  insulin aspart (NOVOLOG) 100 unit/mL injection Inject 4-6 Units into the skin 3 (three) times daily before meals. On sliding scale. If blood surgar is over 150 patient will take   Yes [provider]  isosorbide mononitrate (IMDUR) 60 MG 24 hr tablet Take 60 mg by mouth daily.   Yes [provider]  LANTUS SOLOSTAR 100 UNIT/ML Solostar Pen Inject 40 Units into the skin at bedtime.  10/30/18  Yes [provider]  levETIRAcetam (KEPPRA) 750 MG tablet Take 750 mg by mouth daily.  08/21/18  Yes [provider]  lisinopril (ZESTRIL) 40 MG tablet Take 40 mg by mouth daily.   Yes [provider]  methocarbamol (ROBAXIN) 750 MG  tablet Take 750 mg by mouth every 6 (six) hours as needed for muscle spasms.  10/22/19  Yes [provider]  omeprazole (PRILOSEC) 20 MG capsule Take 1 capsule (20 mg total) by mouth 2 (two) times daily. Patient taking differently: Take 20 mg by mouth 2 (two) times daily as needed (indigestion).  05/14/19  Yes Mansouraty, Telford Nab., MD  potassium chloride (MICRO-K) 10 MEQ CR capsule Take 20 mEq by mouth 2 (two) times daily.  12/22/18  Yes [provider]  pregabalin (LYRICA) 50 MG capsule Take 50 mg by mouth 3 (three) times daily. 01/05/20  Yes [provider]  REFRESH 1.4-0.6 % SOLN Place 1 drop into both eyes as needed (dry eyes).  07/11/18  Yes [provider]  rosuvastatin (CRESTOR) 40 MG tablet Take 40 mg by mouth daily.    Yes [provider]  tamsulosin (FLOMAX) 0.4 MG CAPS capsule Take 0.4 mg by mouth at bedtime.   Yes [provider]  traZODone (DESYREL) 100 MG tablet Take 200 mg by mouth at bedtime.   Yes [provider]  venlafaxine XR (EFFEXOR-XR) 75 MG 24 hr capsule Take 225 mg by mouth daily. 12/09/19  Yes [provider]    Allergies    Crixivan [indinavir], Scallops [shellfish allergy], Sustiva [efavirenz], Ampicillin, and Compazine [prochlorperazine edisylate]  Review of Systems   Review of Systems  Constitutional: Negative for chills and fever.  HENT: Negative for ear pain and sore throat.   Eyes: Positive for pain. Negative for visual  disturbance.  Respiratory: Negative for cough and shortness of breath.   Cardiovascular: Negative for chest pain and palpitations.  Gastrointestinal: Negative for abdominal pain and vomiting.  Genitourinary: Negative for dysuria and hematuria.  Musculoskeletal: Negative for arthralgias and back pain.  Skin: Negative for color change and rash.  Neurological: Positive for dizziness, weakness and headaches. Negative for seizures and syncope.  All other systems reviewed and are negative.   Physical Exam Updated Vital Signs BP (!) 157/92   Pulse 74   Temp 98.2 F (36.8 C) (Oral)   Resp 17   SpO2 93%   Physical Exam Vitals and nursing note reviewed.  Constitutional:      General: He is not in acute distress.    Appearance: He is well-developed.  HENT:     Head: Normocephalic and atraumatic.     Mouth/Throat:     Mouth: Mucous membranes are moist.     Pharynx: Oropharynx is clear.     Comments: Equal elevation of palate bilaterally Eyes:     Extraocular Movements: Extraocular movements intact.     Right eye: No nystagmus.     Left eye: No nystagmus.     Conjunctiva/sclera: Conjunctivae normal.     Pupils: Pupils are equal, round, and reactive to light.  Cardiovascular:     Rate and Rhythm: Normal rate and regular rhythm.     Heart sounds: No murmur heard.   Pulmonary:     Effort: Pulmonary effort is normal. No respiratory distress.     Breath sounds: Normal breath sounds.  Abdominal:     Palpations: Abdomen is soft.     Tenderness: There is no abdominal tenderness.  Musculoskeletal:     Cervical back: Neck supple.  Skin:    General: Skin is warm and dry.  Neurological:     Mental Status: He is alert and oriented to person, place, and time.     Cranial Nerves: No cranial nerve deficit.  Sensory: No sensory deficit.     Motor: No weakness.     Coordination: Coordination normal.     Comments: Not ataxic during ambulation     ED Results / Procedures / Treatments    Labs (all labs ordered are listed, but only abnormal results are displayed) Labs Reviewed  CBC - Abnormal; Notable for the following components:      Result Value   RBC 3.67 (*)    Hemoglobin 12.0 (*)    HCT 36.0 (*)    All other components within normal limits  COMPREHENSIVE METABOLIC PANEL - Abnormal; Notable for the following components:   Potassium 3.2 (*)    Glucose, Bld 259 (*)    Creatinine, Ser 1.90 (*)    GFR calc non Af Amer 38 (*)    GFR calc Af Amer 44 (*)    All other components within normal limits  PROTIME-INR  APTT  DIFFERENTIAL    EKG EKG Interpretation  Date/Time:  Thursday January 21 2020 13:33:28 EDT Ventricular Rate:  89 PR Interval:  158 QRS Duration: 100 QT Interval:  384 QTC Calculation: 467 R Axis:   -13 Text Interpretation: Normal sinus rhythm Nonspecific T wave abnormality Confirmed by Lajean Saver [phone]) on 01/21/2020 4:46:23 PM   Radiology CT HEAD WO CONTRAST  Result Date: 01/21/2020 CLINICAL DATA:  Neuro deficit, acute, stroke suspected Headache for 3 weeks. EXAM: CT HEAD WITHOUT CONTRAST TECHNIQUE: Contiguous axial images were obtained from the base of the skull through the vertex without intravenous contrast. COMPARISON:  Most recent head CT 12/02/2018.  Brain MRI 01/04/2010 FINDINGS: Brain: No intracranial hemorrhage, mass effect, or midline shift. No hydrocephalus. The basilar cisterns are patent. No evidence of territorial infarct or acute ischemia. No extra-axial or intracranial fluid collection. Vascular: Atherosclerosis of skullbase vasculature without hyperdense vessel or abnormal calcification. Skull: No fracture or focal lesion. Sinuses/Orbits: Near complete opacification of the left maxillary sinus with mucosal thickening and small fluid level. Mucosal thickening of left ethmoid air cells. Additional scattered paranasal sinus mucosal thickening. The mastoid air cells are clear. No acute orbital abnormality. Other: None. IMPRESSION:  1. No acute intracranial abnormality. 2. Chronic paranasal sinus disease. Near complete opacification of the left maxillary sinus and small fluid level, recommend correlation for acute sinusitis symptoms. Electronically Signed   By: Keith Rake M.D.   On: 01/21/2020 15:38    Procedures Procedures (including critical care time)  Medications Ordered in ED Medications  sodium chloride flush (NS) 0.9 % injection 3 mL (has no administration in time range)  carvedilol (COREG) tablet 12.5 mg (12.5 mg Oral Given 01/21/20 1745)  acetaminophen (TYLENOL) tablet 1,000 mg (1,000 mg Oral Given 01/21/20 1744)  metoCLOPramide (REGLAN) tablet 10 mg (10 mg Oral Given 01/21/20 1744)    ED Course  I have reviewed the triage vital signs and the nursing notes.  Pertinent labs & imaging results that were available during my care of the patient were reviewed by me and considered in my medical decision making (see chart for details).    MDM Rules/Calculators/A&P                          HB 12, Cr 1.90 (appears to be at baseline), glu 259. Flipped T waves in V4-V6, new from prior EKG one year ago. No active chest pain or other anginal equivalents, low suspicion for ACS at this time.   Head CT without intracranial hemorrhage or mass lesion. Without nausea,  visual disturbance/nystagmus, or ataxia, low suspicion for posterior circulation pathology. Symptoms not consistent with cluster headaches, meningitis, peripheral vertigo.   Initial interventions: reglan, tylenol, coreg   On reassessment, he had improvement in his headache and his BP was 157/92. He feels comfortable with outpatient management of his blood pressure. He sees the [facility] in [place] and is able to arrange rapid follow up. I feel that this is the most appropriate plan for his care. Safe for discharge at this time. Strict return precautions given.   This patient was seen with Dr. Ashok Cordia.   Final Clinical Impression(s) / ED Diagnoses Final  diagnoses:  Hypertension, unspecified type  Acute nonintractable headache, unspecified headache type    Rx / DC Orders ED Discharge Orders    None       Asencion Noble, MD 01/21/20 0349    Lajean Saver, MD 01/22/20 1557

## 2020-01-21 NOTE — ED Notes (Signed)
Patient verbalizes understanding of discharge instructions. Opportunity for questioning and answers were provided. Armband removed by staff, pt discharged from ED Ambulatory.. ° °

## 2020-02-24 ENCOUNTER — Ambulatory Visit: Payer: No Typology Code available for payment source | Admitting: Gastroenterology

## 2022-02-05 ENCOUNTER — Emergency Department (HOSPITAL_COMMUNITY): Payer: No Typology Code available for payment source

## 2022-02-05 ENCOUNTER — Other Ambulatory Visit: Payer: Self-pay

## 2022-02-05 ENCOUNTER — Encounter (HOSPITAL_COMMUNITY): Payer: Self-pay | Admitting: Emergency Medicine

## 2022-02-05 ENCOUNTER — Emergency Department (HOSPITAL_COMMUNITY)
Admission: EM | Admit: 2022-02-05 | Discharge: 2022-02-05 | Payer: No Typology Code available for payment source | Attending: Emergency Medicine | Admitting: Emergency Medicine

## 2022-02-05 DIAGNOSIS — Z7951 Long term (current) use of inhaled steroids: Secondary | ICD-10-CM | POA: Insufficient documentation

## 2022-02-05 DIAGNOSIS — Z794 Long term (current) use of insulin: Secondary | ICD-10-CM | POA: Diagnosis not present

## 2022-02-05 DIAGNOSIS — I129 Hypertensive chronic kidney disease with stage 1 through stage 4 chronic kidney disease, or unspecified chronic kidney disease: Secondary | ICD-10-CM | POA: Insufficient documentation

## 2022-02-05 DIAGNOSIS — F0390 Unspecified dementia without behavioral disturbance: Secondary | ICD-10-CM | POA: Insufficient documentation

## 2022-02-05 DIAGNOSIS — I2510 Atherosclerotic heart disease of native coronary artery without angina pectoris: Secondary | ICD-10-CM | POA: Insufficient documentation

## 2022-02-05 DIAGNOSIS — N183 Chronic kidney disease, stage 3 unspecified: Secondary | ICD-10-CM | POA: Diagnosis not present

## 2022-02-05 DIAGNOSIS — Z79899 Other long term (current) drug therapy: Secondary | ICD-10-CM | POA: Diagnosis not present

## 2022-02-05 DIAGNOSIS — E1122 Type 2 diabetes mellitus with diabetic chronic kidney disease: Secondary | ICD-10-CM | POA: Diagnosis not present

## 2022-02-05 DIAGNOSIS — Z21 Asymptomatic human immunodeficiency virus [HIV] infection status: Secondary | ICD-10-CM | POA: Diagnosis not present

## 2022-02-05 DIAGNOSIS — S91312A Laceration without foreign body, left foot, initial encounter: Secondary | ICD-10-CM | POA: Diagnosis not present

## 2022-02-05 DIAGNOSIS — Z7982 Long term (current) use of aspirin: Secondary | ICD-10-CM | POA: Diagnosis not present

## 2022-02-05 DIAGNOSIS — S99922A Unspecified injury of left foot, initial encounter: Secondary | ICD-10-CM | POA: Diagnosis present

## 2022-02-05 LAB — COMPREHENSIVE METABOLIC PANEL
ALT: 21 U/L (ref 0–44)
AST: 29 U/L (ref 15–41)
Albumin: 4.1 g/dL (ref 3.5–5.0)
Alkaline Phosphatase: 52 U/L (ref 38–126)
Anion gap: 13 (ref 5–15)
BUN: 20 mg/dL (ref 6–20)
CO2: 18 mmol/L — ABNORMAL LOW (ref 22–32)
Calcium: 9.3 mg/dL (ref 8.9–10.3)
Chloride: 107 mmol/L (ref 98–111)
Creatinine, Ser: 2.23 mg/dL — ABNORMAL HIGH (ref 0.61–1.24)
GFR, Estimated: 33 mL/min — ABNORMAL LOW (ref >60)
Glucose, Bld: 206 mg/dL — ABNORMAL HIGH (ref 70–99)
Potassium: 3.8 mmol/L (ref 3.5–5.1)
Sodium: 138 mmol/L (ref 135–145)
Total Bilirubin: 0.7 mg/dL (ref 0.3–1.2)
Total Protein: 7.2 g/dL (ref 6.5–8.1)

## 2022-02-05 LAB — I-STAT CHEM 8, ED
BUN: 22 mg/dL — ABNORMAL HIGH (ref 6–20)
Calcium, Ion: 1.19 mmol/L (ref 1.15–1.40)
Chloride: 107 mmol/L (ref 98–111)
Creatinine, Ser: 2.3 mg/dL — ABNORMAL HIGH (ref 0.61–1.24)
Glucose, Bld: 204 mg/dL — ABNORMAL HIGH (ref 70–99)
HCT: 43 % (ref 39.0–52.0)
Hemoglobin: 14.6 g/dL (ref 13.0–17.0)
Potassium: 3.6 mmol/L (ref 3.5–5.1)
Sodium: 139 mmol/L (ref 135–145)
TCO2: 19 mmol/L — ABNORMAL LOW (ref 22–32)

## 2022-02-05 LAB — CBC
HCT: 39.3 % (ref 39.0–52.0)
Hemoglobin: 13.6 g/dL (ref 13.0–17.0)
MCH: 32.7 pg (ref 26.0–34.0)
MCHC: 34.6 g/dL (ref 30.0–36.0)
MCV: 94.5 fL (ref 80.0–100.0)
Platelets: 247 10*3/uL (ref 150–400)
RBC: 4.16 MIL/uL — ABNORMAL LOW (ref 4.22–5.81)
RDW: 13.6 % (ref 11.5–15.5)
WBC: 11.5 10*3/uL — ABNORMAL HIGH (ref 4.0–10.5)
nRBC: 0 % (ref 0.0–0.2)

## 2022-02-05 LAB — ETHANOL: Alcohol, Ethyl (B): <10 mg/dL (ref <10)

## 2022-02-05 MED ORDER — SODIUM CHLORIDE 0.9 % IV SOLN: INTRAVENOUS | Status: DC

## 2022-02-05 MED ORDER — DOXYCYCLINE HYCLATE 100 MG PO CAPS: 100.0000 mg | ORAL_CAPSULE | Freq: Two times a day (BID) | ORAL | 0 refills | Status: AC

## 2022-02-05 MED ORDER — HYDROMORPHONE HCL 1 MG/ML IJ SOLN
0.5000 mg | Freq: Once | INTRAMUSCULAR | Status: AC
  Administered 2022-02-05: 0.5 mg via INTRAVENOUS
  Filled 2022-02-05: qty 1

## 2022-02-05 MED ORDER — SODIUM CHLORIDE 0.9 % IV SOLN
100.0000 mg | Freq: Once | INTRAVENOUS | Status: AC
  Administered 2022-02-05: 100 mg via INTRAVENOUS
  Filled 2022-02-05: qty 100

## 2022-02-05 MED ORDER — LIDOCAINE-EPINEPHRINE (PF) 2 %-1:200000 IJ SOLN
20.0000 mL | Freq: Once | INTRAMUSCULAR | Status: AC
  Administered 2022-02-05: 20 mL via INTRADERMAL
  Filled 2022-02-05: qty 20

## 2022-02-05 MED ORDER — SODIUM CHLORIDE 0.9 % IV BOLUS: 1000.0000 mL | Freq: Once | INTRAVENOUS | Status: DC

## 2022-02-05 MED ORDER — SODIUM CHLORIDE 0.9 % IV BOLUS
1000.0000 mL | Freq: Once | INTRAVENOUS | Status: AC
  Administered 2022-02-05: 1000 mL via INTRAVENOUS
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
## 2022-02-05 NOTE — Discharge Instructions (Signed)
For pain control you may take at 1000 mg of Tylenol every 8 hours as needed.  Do not let your laceration (cut) get wet for the next 48 hours. After that you may allow soapy water to drain down the wound to clean it. Please do not scrub.  To minimize scarring, you can apply a vaseline based ointment for the next 2 weeks and keep it out of direct sun light. After that, you may apply sunscreen for the next several months. Your stitches will need to be removed in 10-14 days.  Return if your wound appears to be infected (see laceration care instructions).

## 2022-02-05 NOTE — ED Notes (Signed)
Provider is stitching up la

## 2022-02-13 IMAGING — CT CT HEAD W/O CM
3 series · 15 of 47 positions shown, 18 images · non-contrast
Comparison: Most recent head CT 12/02/2018.  Brain MRI 01/04/2010

CLINICAL DATA: Neuro deficit, acute, stroke suspected

Headache for 3 weeks.
EXAM:
CT HEAD WITHOUT CONTRAST
TECHNIQUE: Contiguous axial images were obtained from the base of the skull
through the vertex without intravenous contrast.

[Series 3: head 5.0 h30s · axial · 0.48mm/px · z∈[-146,-6]mm · 9 of 34 slices shown, 12 images]
[im 3/34  brain]
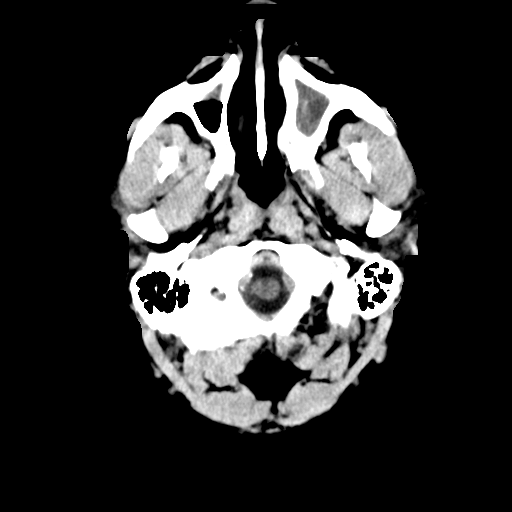
[im 3/34  bone]
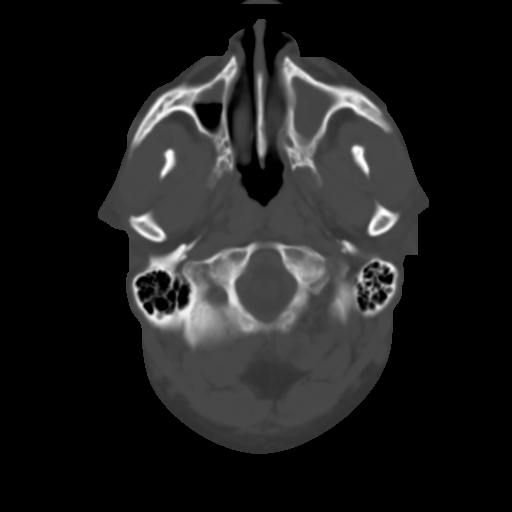
[im 6/34  brain]
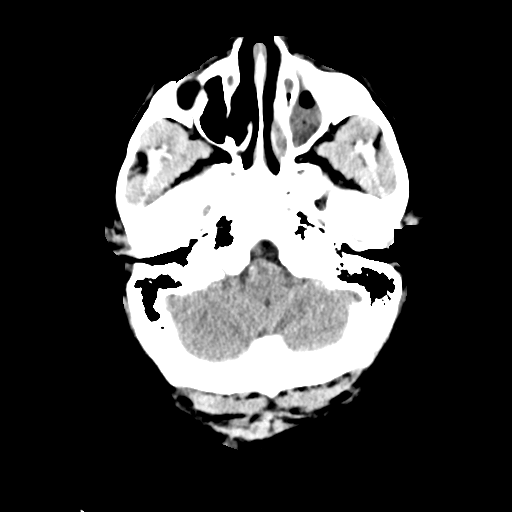
[im 10/34  brain]
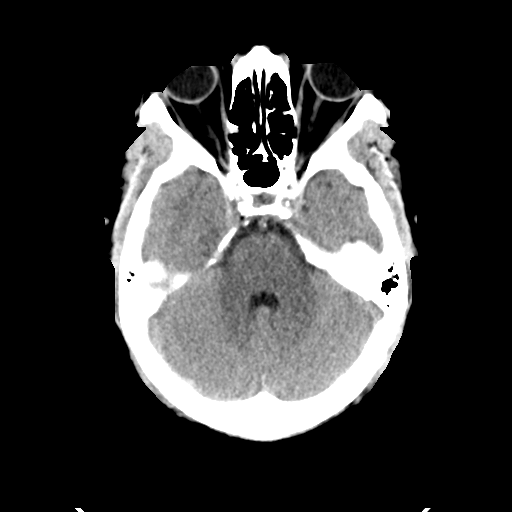
[im 13/34  brain]
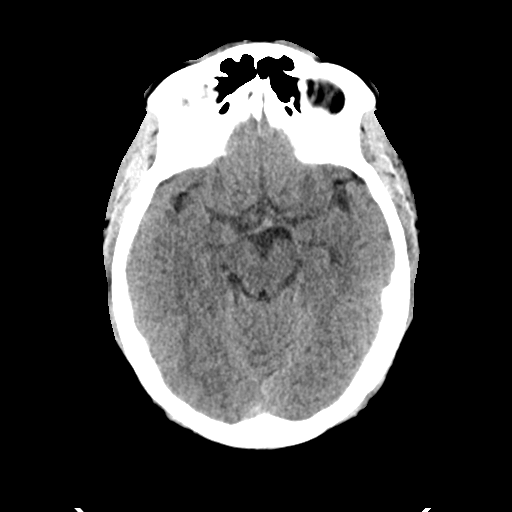
[im 18/34  brain]
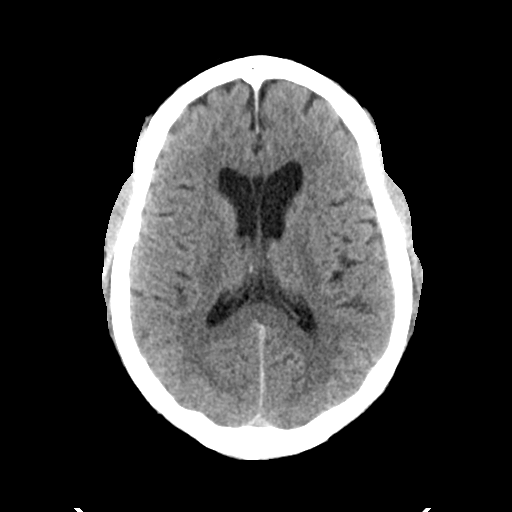
[im 18/34  bone]
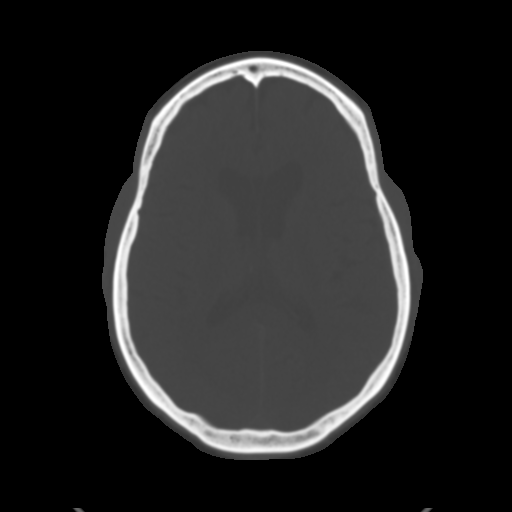
[im 21/34  brain]
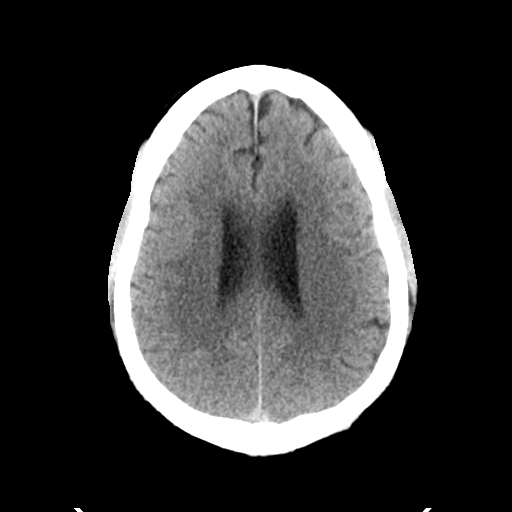
[im 24/34  brain]
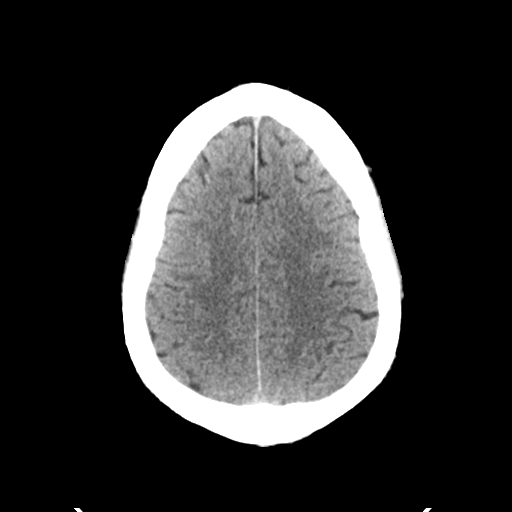
[im 28/34  brain]
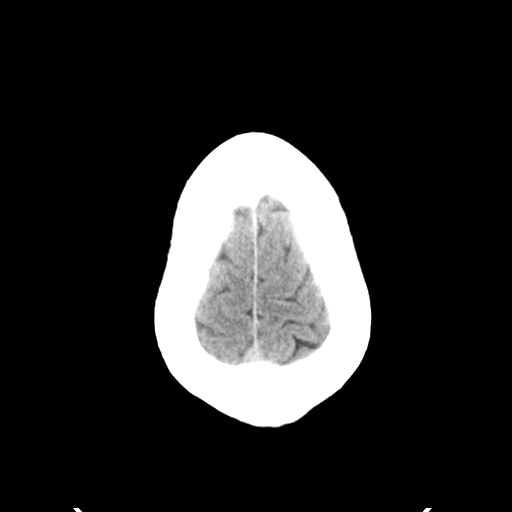
[im 31/34  brain]
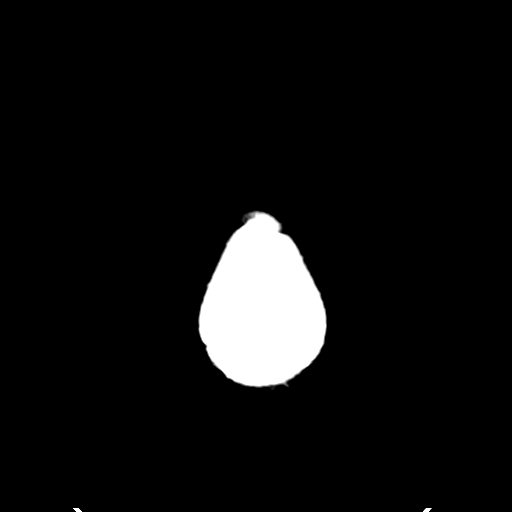
[im 31/34  bone]
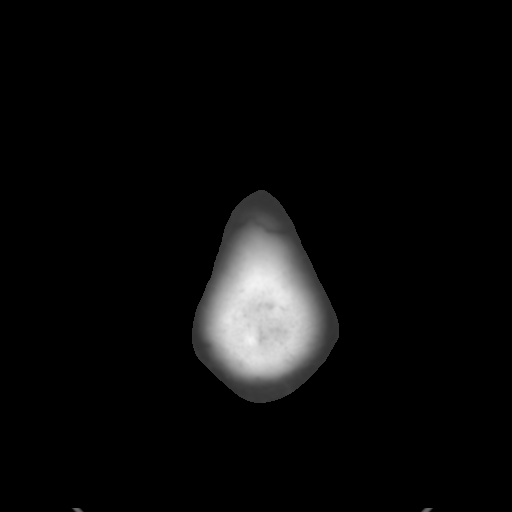

[Series 5: head 3.0 mpr cor · coronal · 0.33mm/px · 3 of 75 slices shown]
[im 25/75  brain]
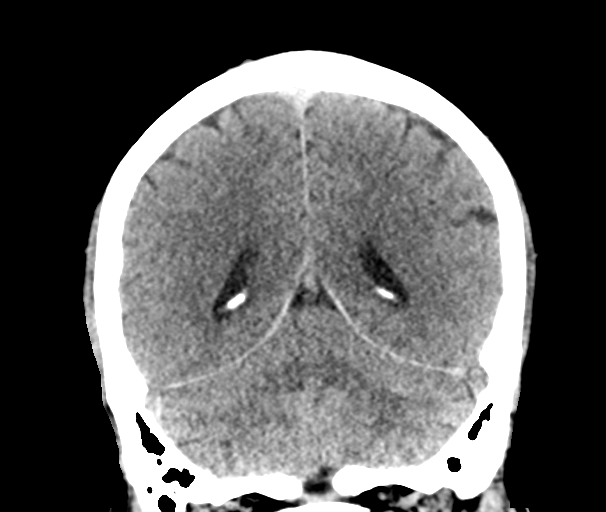
[im 33/75  brain]
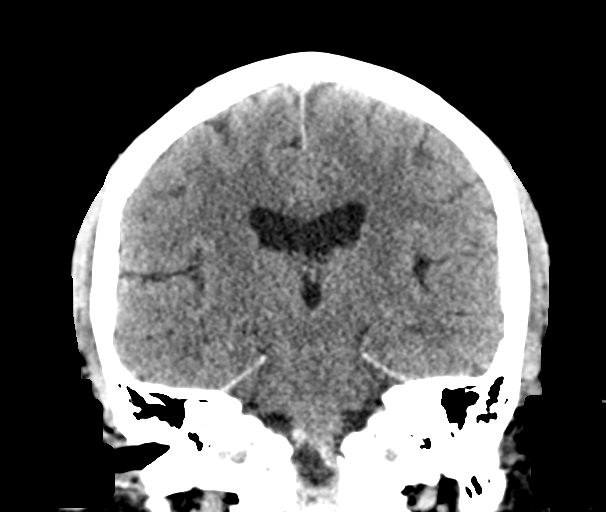
[im 42/75  brain]
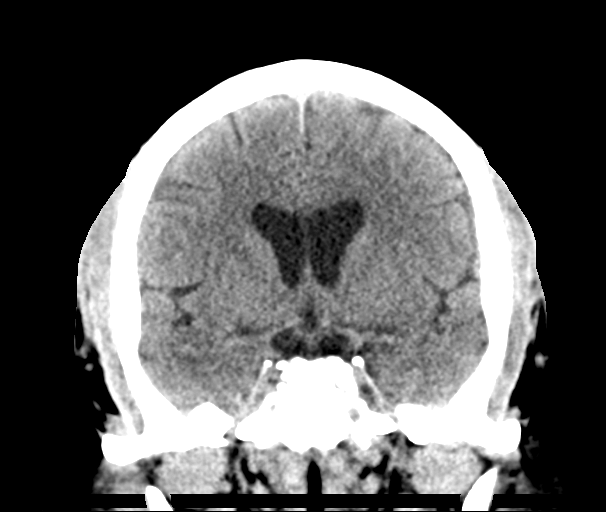

[Series 6: head 3.0 mpr sag · sagittal · 0.33mm/px · 3 of 67 slices shown]
[im 23/67  brain]
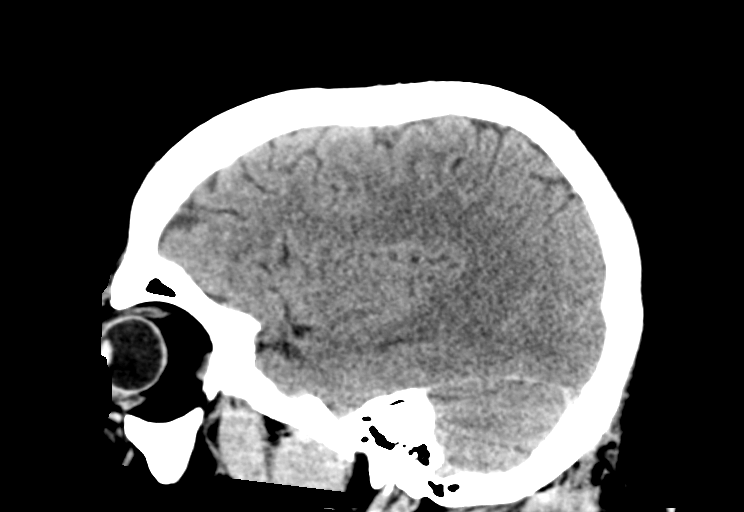
[im 34/67  brain]
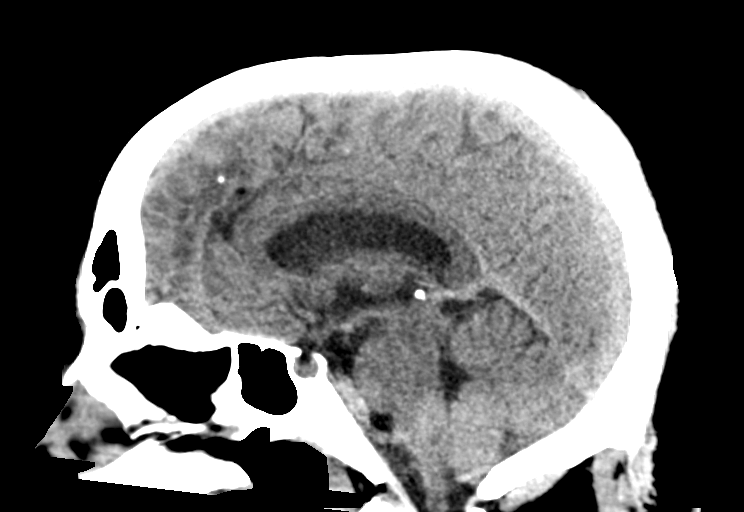
[im 45/67  brain]
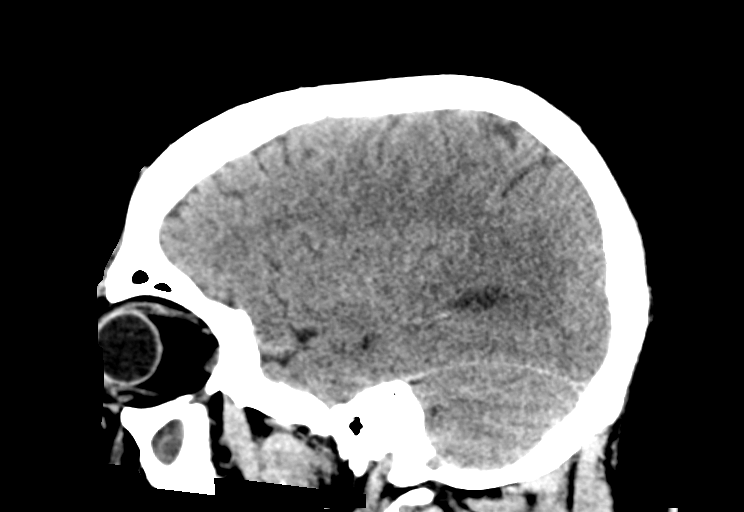

[15 of 47 positions shown; findings below may reference images not displayed]

FINDINGS: Brain: No intracranial hemorrhage, mass effect, or midline shift. No
hydrocephalus. The basilar cisterns are patent. No evidence of
territorial infarct or acute ischemia. No extra-axial or
intracranial fluid collection.

Vascular: Atherosclerosis of skullbase vasculature without
hyperdense vessel or abnormal calcification.

Skull: No fracture or focal lesion.

Sinuses/Orbits: Near complete opacification of the left maxillary
sinus with mucosal thickening and small fluid level. Mucosal
thickening of left ethmoid air cells. Additional scattered paranasal
sinus mucosal thickening. The mastoid air cells are clear. No acute
orbital abnormality.

Other: None.
IMPRESSION: 1. No acute intracranial abnormality.
2. Chronic paranasal sinus disease. Near complete opacification of
the left maxillary sinus and small fluid level, recommend
correlation for acute sinusitis symptoms.

## 2022-05-25 ENCOUNTER — Encounter: Payer: Self-pay | Admitting: Nurse Practitioner

## 2022-05-25 ENCOUNTER — Other Ambulatory Visit: Payer: No Typology Code available for payment source

## 2022-05-25 ENCOUNTER — Ambulatory Visit (INDEPENDENT_AMBULATORY_CARE_PROVIDER_SITE_OTHER): Payer: No Typology Code available for payment source | Admitting: Nurse Practitioner

## 2022-05-25 VITALS — BP 118/82 | HR 64 | Ht 69.0 in | Wt 195.0 lb

## 2022-05-25 DIAGNOSIS — K319 Disease of stomach and duodenum, unspecified: Secondary | ICD-10-CM | POA: Diagnosis not present

## 2022-05-25 DIAGNOSIS — K2980 Duodenitis without bleeding: Secondary | ICD-10-CM

## 2022-05-25 DIAGNOSIS — Z86010 Personal history of colonic polyps: Secondary | ICD-10-CM

## 2022-05-25 NOTE — Patient Instructions (Signed)
_______________________________________________________  If your blood pressure at your visit was 140/90 or greater, please contact your primary care physician to follow up on this.  _______________________________________________________  If you are age 62 or older, your body mass index should be between 23-30. Your Body mass index is 28.8 kg/m. If this is out of the aforementioned range listed, please consider follow up with your Primary Care Provider.  If you are age 54 or younger, your body mass index should be between 19-25. Your Body mass index is 28.8 kg/m. If this is out of the aformentioned range listed, please consider follow up with your Primary Care Provider.   ________________________________________________________  The [facility] providers would like to encourage you to use [facility] to communicate with providers for non-urgent requests or questions.  Due to long hold times on the telephone, sending your provider a message by [facility] may be a faster and more efficient way to get a response.  Please allow 48 business hours for a response.  Please remember that this is for non-urgent requests.  _______________________________________________________   Your provider has requested that you go to the basement level for lab work before leaving today. Press "B" on the elevator. The lab is located at the first door on the left as you exit the elevator.  Patty , RN will be contacting you to schedule your 2 day prep Colonoscopy and EUS.  Due to recent changes in healthcare laws, you may see the results of your imaging and laboratory studies on MyChart before your provider has had a chance to review them.  We understand that in some cases there may be results that are confusing or concerning to you. Not all laboratory results come back in the same time frame and the provider may be waiting for multiple results in order to interpret others.  Please give [place] 48 hours in order for your provider  to thoroughly review all the results before contacting the office for clarification of your results.   Thank you for trusting me with your gastrointestinal care!   Tye Savoy , [place]

## 2022-05-25 NOTE — Progress Notes (Signed)
.   Assessment    Patient profile:  Harold Davenport is a 62 y.o. male known to Harold Davenport with a past medical history of HTN, .HIV, CKD, seizure disorder ( not on treatment ).  See PMH /PSH for additional history  # 62 year old male with history of adenomatous colon polyps. Multiple polyps on last colonoscopy in 2020.  Stool was found throughout the colon on exam. A three year colonoscopy was recommended and he is here to get scheduled.    # History of submucosal lesion in the stomach.  He had an EUS with [place] in January 2021.  Definitive diagnosis not obtained.  We advised a repeat EUS in 3 to 4 months at the [place] or at our practice but this was  not done.   # History of seizures. No activity in > 5 years. Seizure meds stopped about a year ago.   # CKD, he tells me stage 3. [facility]ed at [facility].   # CAD s/p stent placement in his 42's. On daily baby aspirin  # HIV  Plan   Schedule for a surveillance colonoscopy to be done at same time as EUS at the hospital. The risks and benefits of colonoscopy with possible polypectomy / biopsies were discussed and the patient agrees to proceed.  Two day bowel prep Schedule for EUS for repeat assessment of submucosal gastric lesion. The risks and benefits of EUS with possible biopsies were discussed with the patient who agrees to proceed.  tTg, IgA today.  Celiac could not be definitively excluded on duodenal biopsies in 2020.  Though celiac disease seems unlikely will obtain serologies today   HPI    Chief complaint: Time for colonoscopy.  Has a history of colon polyps   Harold Davenport was seen here June 2020.  He had apparently had undergone EGD and colonoscopy with the [facility] in February 2020 but the procedures were incomplete due to intolerance to conscious sedation.  Apparently an abnormality of the esophagus was found and needed evaluation so patient was referred to [place] to have the procedures done under MAC  EGD September 2020 showed findings suspicious  for Barrett's esophagus, grade B esophagitis, gastritis, submucosal nodule in the stomach, erythematous duodenopathy in the bulb and D1 and D2 angle. See reports below. On colonoscopy there was stool throughout the colon.  Multiple polyps were removed.  Polyp pathology combination of hyperplastic and tubular adenomas.   For evaluation of the submucosal nodule in the stomach patient underwent an EUS in January 2021.  The "submucosal lesion" was biopsied but we were unable to get a final diagnosis.  Plan was for repeat endoscopic ultrasound through the [place] or [place] in the following 3 to 4 months but that was not done.  Our practice did not perform the procedure and patient says that he has not had it done through the [place] or anyone else  Patient is here because he is due for follow-up colonoscopy.  He has no GI complaints. Appetite is fine. Weight stable. Normal BMs. No blood in stool    Previous GI Evaluation   Sept 2020 EGD -No gross lesions in the proximal/middle esophagus -Salmon-colored mucosa suspicious for Barrett's esophagus distally -Small hiatal hernia -LA grade B esophagitis -Erythematous mucosa in the antrum -1 submucosal nodule in the stomach -Erythematous duodenopathy in the bulb and D1/D2 angle -No lesions in the second portion of the duodenum  Sept 2020 colonoscopy  Stool in the entire colon.Eight 2-6 millimeter polyps in the rectum, descending colon,  transverse colon, and ascending colon.  Polyps were removed.  Nonbleeding nonthrombosed internal hemorrhoids  Diagnosis 1. Surgical [P], gastric BX - GASTRIC ANTRAL AND OXYNTIC MUCOSA WITH NO SPECIFIC HISTOPATHOLOGIC CHANGES - WARTHIN STARRY STAIN IS NEGATIVE FOR HELICOBACTER PYLORI 2. Surgical [P], stomach, antral - GASTRIC ANTRAL MUCOSA WITH NO SPECIFIC HISTOPATHOLOGIC CHANGES - WARTHIN STARRY STAIN IS NEGATIVE FOR HELICOBACTER PYLORI - NO SUBMUCOSA IS PRESENT FOR EVALUATION 3. Surgical [P], duodenal bulb - CHRONIC DUODENITIS  WITH PATCHY SURFACE GASTRIC FOVEOLAR METAPLASIA AND MILD VILLOUS ARCHITECTURAL CHANGES. SEE NOTE 4. Surgical [P], esophagus - ESOPHAGEAL SQUAMOUS AND CARDIAC MUCOSA WITH FOCAL MARKED REACTIVE CHANGES, SUGGESTIVE OF ADJACENT EROSION - NEGATIVE FOR INTESTINAL METAPLASIA OR DYSPLASIA 5. Surgical [P], distal esophagus - ESOPHAGEAL SQUAMOUS AND CARDIAC MUCOSA WITH NO SPECIFIC HISTOPATHOLOGIC CHANGES - NEGATIVE FOR INTESTINAL METAPLASIA OR DYSPLASIA 6. Surgical [P], colon, transverse, ascending, descending, rectal, polyp (8) - TUBULAR ADENOMA WITHOUT HIGH-GRADE DYSPLASIA OR MALIGNANCY - HYPERPLASTIC POLYP(S)  Jan 2021 EUS EGD impression: -No gross lesions in the esophagus.  Z-line irregular, 41 cm from the incisors.  Small hiatal hernia.  Erythematous mucosa in the antrum.  Scar in the antrum.  No gross lesions in the stomach.  No gross lesions in the duodenal bulb, and the first portion of the duodenum and second portion of the duodenum EUS impression:  -No evidence of significant subepithelial lesion noted or intramural lesion noted.  Extensive look at the region of previous scar site.  No malignant appearing lymph nodes were visualized in the celiac region and perigastric region   Labs:     Latest Ref Rng & Units 02/05/2022    2:59 AM 02/05/2022    2:02 AM 01/21/2020    1:58 PM  CBC  WBC 4.0 - 10.5 K/uL  11.5  8.0   Hemoglobin 13.0 - 17.0 g/dL 14.6  13.6  12.0   Hematocrit 39.0 - 52.0 % 43.0  39.3  36.0   Platelets 150 - 400 K/uL  247  245        Latest Ref Rng & Units 02/05/2022    2:02 AM 01/21/2020    1:58 PM 11/25/2018   12:30 PM  Hepatic Function  Total Protein 6.5 - 8.1 g/dL 7.2  6.6  6.8   Albumin 3.5 - 5.0 g/dL 4.1  3.7  3.9   AST 15 - 41 U/L 29  23  22    ALT 0 - 44 U/L 21  19  20    Alk Phosphatase 38 - 126 U/L 52  48  57   Total Bilirubin 0.3 - 1.2 mg/dL 0.7  0.3  0.5      Past Medical History:  Diagnosis Date   Adjustment disorder with depressed mood    Alcohol  dependence ([place])    Allergic rhinitis    Allergies    " climactic"   Anal fissure    Anemia    Arthritis    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)    CAD (coronary artery disease)    Cannabis dependence (HCC)    Carpal tunnel syndrome    Chronic kidney disease, stage 3, mod decreased GFR (HCC)    Cocaine dependence (HCC)    Colon polyps    Complication of anesthesia    per pt, had uncontrollable movement during sedation./last procedure imcomplete   Dementia ([place])    Depression    Diabetes ([place])    Gastric lesion    submucosal gastric lesion/gastric nodule  GERD (gastroesophageal reflux disease)    Heart attack ([place])    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   HIV (human immunodeficiency virus infection) ([place])    Hypertension    Peripheral neuropathy    Renal failure    Seizure ([place])    last   Sleep apnea    have a cpap but use it when he is "Heavy"   Spinal headache    Wears glasses     Past Surgical History:  Procedure Laterality Date   [place], [place] at the [place]. First one done at 64. Recently had one done (2019 or 2020) and wasn't completed   CORONARY STENT PLACEMENT     ESOPHAGOGASTRODUODENOSCOPY     [place], [place] at the [place]. Thinks it was done 2020 it was incomplete   ESOPHAGOGASTRODUODENOSCOPY (EGD) WITH PROPOFOL N/A 05/14/2019   Procedure: ESOPHAGOGASTRODUODENOSCOPY (EGD) WITH PROPOFOL;  Surgeon: Rush Davenport Telford Nab., MD;  Location: [place];  Service: Gastroenterology;  Laterality: N/A;   EUS N/A 05/14/2019   Procedure: UPPER ENDOSCOPIC ULTRASOUND (EUS) RADIAL;  Surgeon: Irving Copas., MD;  Location: [place];  Service: Gastroenterology;  Laterality: N/A;    Current Medications, Allergies, Family History and Social History were reviewed in [employer] record.     Current Outpatient Medications  Medication Sig Dispense Refill   ALLERGY RELIEF, LORATADINE, 10 MG tablet  Take 10 mg by mouth daily.      amLODipine (NORVASC) 10 MG tablet Take 10 mg by mouth daily.     aspirin EC 81 MG tablet Take 81 mg by mouth daily.     bictegravir-emtricitabine-tenofovir AF (BIKTARVY) 50-200-25 MG TABS tablet Take 1 tablet by mouth daily.     carvedilol (COREG) 12.5 MG tablet Take 12.5 mg by mouth daily.      cholecalciferol (VITAMIN D) 25 MCG (1000 UT) tablet Take 1,000 Units by mouth daily.      diclofenac sodium (VOLTAREN) 1 % GEL Apply 2 g topically 4 (four) times daily as needed (pain).      diphenhydrAMINE (BENADRYL) 25 mg capsule Take 25 mg by mouth every 6 (six) hours as needed for itching or allergies.      fluticasone (FLONASE) 50 MCG/ACT nasal spray Place 1 spray into both nostrils 2 (two) times daily as needed for allergies.      hydrochlorothiazide (HYDRODIURIL) 25 MG tablet Take 25 mg by mouth every morning.     insulin aspart (NOVOLOG) 100 unit/mL injection Inject 4-6 Units into the skin 3 (three) times daily before meals. On sliding scale. If blood surgar is over 150 patient will take     isosorbide mononitrate (IMDUR) 60 MG 24 hr tablet Take 60 mg by mouth daily.     LANTUS SOLOSTAR 100 UNIT/ML Solostar Pen Inject 40 Units into the skin at bedtime.      levETIRAcetam (KEPPRA) 750 MG tablet Take 750 mg by mouth daily.      lisinopril (ZESTRIL) 40 MG tablet Take 40 mg by mouth daily.     methocarbamol (ROBAXIN) 750 MG tablet Take 750 mg by mouth every 6 (six) hours as needed for muscle spasms.      omeprazole (PRILOSEC) 20 MG capsule Take 1 capsule (20 mg total) by mouth 2 (two) times daily. (Patient taking differently: Take 20 mg by mouth 2 (two) times daily as needed (indigestion).) 60 capsule 3   potassium chloride (MICRO-K) 10 MEQ CR capsule Take 20 mEq  by mouth 2 (two) times daily.      pregabalin (LYRICA) 50 MG capsule Take 50 mg by mouth 3 (three) times daily.     REFRESH 1.4-0.6 % SOLN Place 1 drop into both eyes as needed (dry eyes).      rosuvastatin  (CRESTOR) 40 MG tablet Take 40 mg by mouth daily.      tamsulosin (FLOMAX) 0.4 MG CAPS capsule Take 0.4 mg by mouth at bedtime.     traZODone (DESYREL) 100 MG tablet Take 200 mg by mouth at bedtime.     venlafaxine XR (EFFEXOR-XR) 75 MG 24 hr capsule Take 225 mg by mouth daily.     No current facility-administered medications for this visit.    Review of Systems: No chest pain. No shortness of breath. No urinary complaints.    Physical Exam  Wt Readings from Last 3 Encounters:  05/25/22 195 lb (88.5 kg)  05/14/19 198 lb (89.8 kg)  04/30/19 199 lb 6 oz (90.4 kg)    BP 118/82   Pulse 64   Ht 5' 9"  (1.753 m)   Wt 195 lb (88.5 kg)   BMI 28.80 kg/m  Constitutional:  Generally well appearing male in no acute distress. Psychiatric: Pleasant. Normal mood and affect. Behavior is normal. EENT: Pupils normal.  Conjunctivae are normal. No scleral icterus. Neck supple.  Cardiovascular: Normal rate, regular rhythm.  Pulmonary/chest: Effort normal and breath sounds normal. No wheezing, rales or rhonchi. Abdominal: Soft, nondistended, nontender. Bowel sounds active throughout. There are no masses palpable. No hepatomegaly. Neurological: Alert and oriented to person place and time. Musculoskeletal:  Extremities: ** edema Skin: Skin is warm and dry. No rashes noted.  Tye Savoy, NP  05/25/2022, 2:14 PM  Cc:  [place]

## 2022-05-26 LAB — TISSUE TRANSGLUTAMINASE, IGA: (tTG) Ab, IgA: <1 U/mL

## 2022-05-26 LAB — IGA: Immunoglobulin A: 250 mg/dL (ref 70–320)

## 2022-05-26 NOTE — Progress Notes (Signed)
Attending Physician's Attestation   I have reviewed the chart.   I agree with the Advanced Practitioner's note, impression, and recommendations with any updates as below. Agree with moving forward with colon polyp surveillance with colonoscopy and we will perform 1 more upper EUS to ensure everything is stable in regards to the previously noted possible subepithelial lesion that was not as present on EUS in 2020.  He can be placed on my nonurgent hospital based list.   Justice Britain, MD [facility] Gastroenterology Advanced Endoscopy Office # [phone]

## 2022-05-28 ENCOUNTER — Other Ambulatory Visit: Payer: Self-pay

## 2022-05-28 ENCOUNTER — Telehealth: Payer: Self-pay

## 2022-05-28 DIAGNOSIS — K319 Disease of stomach and duodenum, unspecified: Secondary | ICD-10-CM

## 2022-05-28 DIAGNOSIS — Z86010 Personal history of colonic polyps: Secondary | ICD-10-CM

## 2022-05-28 MED ORDER — PEG 3350-KCL-NA BICARB-NACL 420 G PO SOLR: 4000.0000 mL | Freq: Once | ORAL | 0 refills | Status: AC

## 2022-05-28 NOTE — Telephone Encounter (Signed)
-----  Message from Irving Copas., MD sent at 05/26/2022  3:51 AM EST -----    ----- Message ----- From: Willia Craze, NP Sent: 05/25/2022   5:17 PM EST To: Irving Copas., MD

## 2022-05-28 NOTE — Telephone Encounter (Signed)
   Attending Physician's Attestation    I have reviewed the chart.    I agree with the Advanced Practitioner's note, impression, and recommendations with any updates as below. Agree with moving forward with colon polyp surveillance with colonoscopy and we will perform 1 more upper EUS to ensure everything is stable in regards to the previously noted possible subepithelial lesion that was not as present on EUS in 2020.  He can be placed on my nonurgent hospital based list.     Justice Britain, MD [facility] Gastroenterology Advanced Endoscopy Office # [phone]

## 2022-05-28 NOTE — Telephone Encounter (Signed)
EUS colon has been scheduled for 07/26/22 at 915 am at [facility] with [facility]   Left message on machine to call back   Golytely has been sent to the pharmacy

## 2022-05-29 NOTE — Telephone Encounter (Signed)
Tried again to reach pt and voicemail is not set up will attempt later   All instructions have been mailed to the pt

## 2022-05-30 NOTE — Telephone Encounter (Signed)
Attempted to reach pt again and no answer-voicemail not set up

## 2022-05-31 NOTE — Telephone Encounter (Signed)
I have been unable to reach the pt by phone  All information mailed to the pt home. Pt does not have My Chart.

## 2022-05-31 NOTE — Telephone Encounter (Signed)
Left message on machine to call back  

## 2022-07-19 ENCOUNTER — Encounter (HOSPITAL_COMMUNITY): Payer: Self-pay | Admitting: Gastroenterology

## 2022-07-19 NOTE — Progress Notes (Signed)
[facility] [facility]  Prep instructions- reviewed  [facility]  EKG-01/22/20 Echo- n/a Cath- n/a Stress- n/a ICD/PM- n/a Blood thinner- n/a GLP-1-n/a  Anesthesia Review:  History of CAD, HTN, MI (last one 09), CKD, adjustment disorder, seizures (none in a while, off seizure meds) HIV, DM. Per pt last endo procedure he had at [place] he said he had some uncontrollable movement during sedation and procedure was incomplete.

## 2022-07-23 ENCOUNTER — Telehealth: Payer: Self-pay | Admitting: Nurse Practitioner

## 2022-07-23 ENCOUNTER — Other Ambulatory Visit: Payer: Self-pay

## 2022-07-23 MED ORDER — PEG 3350-KCL-NA BICARB-NACL 420 G PO SOLR: ORAL | 0 refills | Status: DC

## 2022-07-23 NOTE — Telephone Encounter (Signed)
Inbound call from patient ,requesting to speak with a nurse regarding up coming procedure he said he has not received any medication to prepare himself for procedure.please advise

## 2022-07-23 NOTE — Telephone Encounter (Signed)
Re-transmitted the prescription to the [facility].

## 2022-07-24 ENCOUNTER — Telehealth: Payer: Self-pay | Admitting: Nurse Practitioner

## 2022-07-24 MED ORDER — PEG 3350-KCL-NA BICARB-NACL 420 G PO SOLR: 4000.0000 mL | Freq: Once | ORAL | 0 refills | Status: AC

## 2022-07-24 NOTE — Telephone Encounter (Signed)
Golytely sent to [place]

## 2022-07-24 NOTE — Telephone Encounter (Signed)
PT needs to have Golytely sent to the [facility] in [place] and it needs to be set for PICKUP TODAY not mailed. Requesting callback once it has sent.

## 2022-07-25 NOTE — Anesthesia Preprocedure Evaluation (Signed)
Anesthesia Evaluation  Patient identified by MRN, date of birth, ID band Patient awake    Reviewed: Allergy & Precautions, NPO status , Patient's Chart, lab work & pertinent test results  History of Anesthesia Complications (+) POST - OP SPINAL HEADACHE and history of anesthetic complications  Airway Mallampati: III  TM Distance: >3 FB Neck ROM: Full    Dental no notable dental hx.    Pulmonary sleep apnea and Continuous Positive Airway Pressure Ventilation , Current Smoker and Patient abstained from smoking.   Pulmonary exam normal        Cardiovascular hypertension, Pt. on medications and Pt. on home beta blockers + CAD and + Past MI   Rhythm:Regular Rate:Normal     Neuro/Psych  Headaches, Seizures -,    Depression   Dementia    GI/Hepatic ,GERD  Medicated,,(+)     substance abuse  alcohol use, cocaine use and marijuana useColon polyp   Endo/Other  diabetes, Type 2, Insulin Dependent, Oral Hypoglycemic Agents    Renal/GU CRFRenal disease  negative genitourinary   Musculoskeletal  (+) Arthritis , Osteoarthritis,    Abdominal Normal abdominal exam  (+)   Peds  Hematology  (+) Blood dyscrasia, anemia , HIV  Anesthesia Other Findings   Reproductive/Obstetrics                             Anesthesia Physical Anesthesia Plan  ASA: 3  Anesthesia Plan: MAC   Post-op Pain Management:    Induction: Intravenous  PONV Risk Score and Plan: Propofol infusion  Airway Management Planned: Simple Face Mask and Nasal Cannula  Additional Equipment: None  Intra-op Plan:   Post-operative Plan:   Informed Consent: I have reviewed the patients History and Physical, chart, labs and discussed the procedure including the risks, benefits and alternatives for the proposed anesthesia with the patient or authorized representative who has indicated his/her understanding and acceptance.     Dental  advisory given  Plan Discussed with: CRNA  Anesthesia Plan Comments:        Anesthesia Quick Evaluation

## 2022-07-26 ENCOUNTER — Ambulatory Visit (HOSPITAL_COMMUNITY): Payer: No Typology Code available for payment source | Admitting: Anesthesiology

## 2022-07-26 ENCOUNTER — Encounter (HOSPITAL_COMMUNITY)
Admission: RE | Disposition: A | Discharge status: Home / Self Care | Payer: Self-pay | Source: Home / Self Care | Attending: Gastroenterology

## 2022-07-26 ENCOUNTER — Encounter (HOSPITAL_COMMUNITY): Payer: Self-pay | Admitting: Gastroenterology

## 2022-07-26 ENCOUNTER — Ambulatory Visit (HOSPITAL_COMMUNITY)
Admission: RE | Admit: 2022-07-26 | Discharge: 2022-07-26 | Disposition: A | Discharge status: Home / Self Care | Payer: No Typology Code available for payment source | Attending: Gastroenterology | Admitting: Gastroenterology

## 2022-07-26 DIAGNOSIS — M1990 Unspecified osteoarthritis, unspecified site: Secondary | ICD-10-CM | POA: Insufficient documentation

## 2022-07-26 DIAGNOSIS — Z79899 Other long term (current) drug therapy: Secondary | ICD-10-CM | POA: Insufficient documentation

## 2022-07-26 DIAGNOSIS — I899 Noninfective disorder of lymphatic vessels and lymph nodes, unspecified: Secondary | ICD-10-CM | POA: Insufficient documentation

## 2022-07-26 DIAGNOSIS — F17200 Nicotine dependence, unspecified, uncomplicated: Secondary | ICD-10-CM | POA: Insufficient documentation

## 2022-07-26 DIAGNOSIS — Z7984 Long term (current) use of oral hypoglycemic drugs: Secondary | ICD-10-CM | POA: Diagnosis not present

## 2022-07-26 DIAGNOSIS — Z794 Long term (current) use of insulin: Secondary | ICD-10-CM | POA: Insufficient documentation

## 2022-07-26 DIAGNOSIS — K3189 Other diseases of stomach and duodenum: Secondary | ICD-10-CM | POA: Diagnosis not present

## 2022-07-26 DIAGNOSIS — K2950 Unspecified chronic gastritis without bleeding: Secondary | ICD-10-CM

## 2022-07-26 DIAGNOSIS — B20 Human immunodeficiency virus [HIV] disease: Secondary | ICD-10-CM | POA: Insufficient documentation

## 2022-07-26 DIAGNOSIS — E1122 Type 2 diabetes mellitus with diabetic chronic kidney disease: Secondary | ICD-10-CM | POA: Diagnosis not present

## 2022-07-26 DIAGNOSIS — I129 Hypertensive chronic kidney disease with stage 1 through stage 4 chronic kidney disease, or unspecified chronic kidney disease: Secondary | ICD-10-CM | POA: Insufficient documentation

## 2022-07-26 DIAGNOSIS — Z86010 Personal history of colonic polyps: Secondary | ICD-10-CM | POA: Diagnosis not present

## 2022-07-26 DIAGNOSIS — D125 Benign neoplasm of sigmoid colon: Secondary | ICD-10-CM | POA: Diagnosis not present

## 2022-07-26 DIAGNOSIS — K641 Second degree hemorrhoids: Secondary | ICD-10-CM | POA: Insufficient documentation

## 2022-07-26 DIAGNOSIS — F0390 Unspecified dementia without behavioral disturbance: Secondary | ICD-10-CM | POA: Insufficient documentation

## 2022-07-26 DIAGNOSIS — K2289 Other specified disease of esophagus: Secondary | ICD-10-CM | POA: Insufficient documentation

## 2022-07-26 DIAGNOSIS — K319 Disease of stomach and duodenum, unspecified: Secondary | ICD-10-CM

## 2022-07-26 DIAGNOSIS — I2510 Atherosclerotic heart disease of native coronary artery without angina pectoris: Secondary | ICD-10-CM | POA: Insufficient documentation

## 2022-07-26 DIAGNOSIS — D122 Benign neoplasm of ascending colon: Secondary | ICD-10-CM | POA: Insufficient documentation

## 2022-07-26 DIAGNOSIS — G4730 Sleep apnea, unspecified: Secondary | ICD-10-CM | POA: Diagnosis not present

## 2022-07-26 DIAGNOSIS — D123 Benign neoplasm of transverse colon: Secondary | ICD-10-CM

## 2022-07-26 DIAGNOSIS — I252 Old myocardial infarction: Secondary | ICD-10-CM | POA: Diagnosis not present

## 2022-07-26 DIAGNOSIS — Z1211 Encounter for screening for malignant neoplasm of colon: Secondary | ICD-10-CM | POA: Insufficient documentation

## 2022-07-26 DIAGNOSIS — N183 Chronic kidney disease, stage 3 unspecified: Secondary | ICD-10-CM | POA: Diagnosis not present

## 2022-07-26 DIAGNOSIS — K929 Disease of digestive system, unspecified: Secondary | ICD-10-CM | POA: Diagnosis not present

## 2022-07-26 DIAGNOSIS — K2100 Gastro-esophageal reflux disease with esophagitis, without bleeding: Secondary | ICD-10-CM | POA: Diagnosis not present

## 2022-07-26 DIAGNOSIS — F32A Depression, unspecified: Secondary | ICD-10-CM | POA: Diagnosis not present

## 2022-07-26 DIAGNOSIS — K644 Residual hemorrhoidal skin tags: Secondary | ICD-10-CM | POA: Insufficient documentation

## 2022-07-26 DIAGNOSIS — Z21 Asymptomatic human immunodeficiency virus [HIV] infection status: Secondary | ICD-10-CM | POA: Insufficient documentation

## 2022-07-26 DIAGNOSIS — F1220 Cannabis dependence, uncomplicated: Secondary | ICD-10-CM | POA: Insufficient documentation

## 2022-07-26 HISTORY — PX: ESOPHAGOGASTRODUODENOSCOPY (EGD) WITH PROPOFOL: SHX5813

## 2022-07-26 HISTORY — PX: EUS: SHX5427

## 2022-07-26 HISTORY — PX: BIOPSY: SHX5522

## 2022-07-26 HISTORY — PX: POLYPECTOMY: SHX5525

## 2022-07-26 HISTORY — PX: COLONOSCOPY WITH PROPOFOL: SHX5780

## 2022-07-26 LAB — GLUCOSE, CAPILLARY: Glucose-Capillary: 77 mg/dL (ref 70–99)

## 2022-07-26 SURGERY — COLONOSCOPY WITH PROPOFOL
Anesthesia: Monitor Anesthesia Care

## 2022-07-26 MED ORDER — PROPOFOL 1000 MG/100ML IV EMUL
INTRAVENOUS | Status: AC
  Filled 2022-07-26: qty 100

## 2022-07-26 MED ORDER — SODIUM CHLORIDE 0.9 % IV SOLN: INTRAVENOUS | Status: DC

## 2022-07-26 MED ORDER — LACTATED RINGERS IV SOLN: INTRAVENOUS | Status: DC

## 2022-07-26 MED ORDER — PROPOFOL 500 MG/50ML IV EMUL
INTRAVENOUS | Status: DC | PRN
  Administered 2022-07-26: 130 ug/kg/min via INTRAVENOUS

## 2022-07-26 MED ORDER — PROPOFOL 10 MG/ML IV BOLUS
INTRAVENOUS | Status: DC | PRN
  Administered 2022-07-26: 10 mg via INTRAVENOUS
  Administered 2022-07-26: 20 mg via INTRAVENOUS

## 2022-07-26 MED ORDER — LIDOCAINE 2% (20 MG/ML) 5 ML SYRINGE
INTRAMUSCULAR | Status: DC | PRN
  Administered 2022-07-26: 100 mg via INTRAVENOUS

## 2022-07-26 SURGICAL SUPPLY — 22 items

## 2022-07-26 NOTE — Transfer of Care (Signed)
Immediate Anesthesia Transfer of Care Note  Patient: Harold Davenport  Procedure(s) Performed: COLONOSCOPY WITH PROPOFOL UPPER ENDOSCOPIC ULTRASOUND (EUS) RADIAL BIOPSY POLYPECTOMY  Patient Location: PACU  Anesthesia Type:MAC  Level of Consciousness: sedated  Airway & Oxygen Therapy: Patient Spontanous Breathing and Patient connected to face mask oxygen  Post-op Assessment: Report given to RN and Post -op Vital signs reviewed and stable  Post vital signs: Reviewed and stable  Last Vitals:  Vitals Value Taken Time  BP    Temp    Pulse    Resp    SpO2      Last Pain:  Vitals:   07/26/22 0749  TempSrc: Temporal  PainSc: 0-No pain         Complications: No notable events documented.

## 2022-07-26 NOTE — Op Note (Signed)
[facility] Patient Name: Harold Davenport Procedure Date: 07/26/2022 MRN: [id number] Attending MD: Justice Britain , MD, [id number] Date of Birth: 10-26-1960 CSN: [id number] Age: 62 Admit Type: Outpatient Procedure:                Colonoscopy Indications:              Surveillance: Personal history of adenomatous                            polyps on last colonoscopy > 3 years ago Providers:                Justice Britain, MD, Jeanella Cara, RN,                            Cletis Athens, Technician Referring MD:             Willia Craze, [facility] Medicines:                Monitored Anesthesia Care Complications:            No immediate complications. Estimated Blood Loss:     Estimated blood loss was minimal. Procedure:                Pre-Anesthesia Assessment:                           - Prior to the procedure, a History and Physical                            was performed, and patient medications and                            allergies were reviewed. The patient's tolerance of                            previous anesthesia was also reviewed. The risks                            and benefits of the procedure and the sedation                            options and risks were discussed with the patient.                            All questions were answered, and informed consent                            was obtained. Prior Anticoagulants: The patient has                            taken no anticoagulant or antiplatelet agents                            except for aspirin. ASA Grade Assessment: III - A  patient with severe systemic disease. After                            reviewing the risks and benefits, the patient was                            deemed in satisfactory condition to undergo the                            procedure.                           After obtaining informed consent, the colonoscope                             was passed under direct vision. Throughout the                            procedure, the patient's blood pressure, pulse, and                            oxygen saturations were monitored continuously. The                            [id number]) Olympus colonoscope was                            introduced through the anus and advanced to the the                            cecum, identified by appendiceal orifice and                            ileocecal valve. The colonoscopy was performed                            without difficulty. The patient tolerated the                            procedure. The quality of the bowel preparation was                            good. The ileocecal valve, appendiceal orifice, and                            rectum were photographed. Scope In: 9:17:33 AM Scope Out: 9:31:57 AM Scope Withdrawal Time: 0 hours 12 minutes 34 seconds  Total Procedure Duration: 0 hours 14 minutes 24 seconds  Findings:      The digital rectal exam findings include hemorrhoids. Pertinent       negatives include no palpable rectal lesions.      Four sessile polyps were found in the sigmoid colon, transverse colon       and ascending colon. The polyps were 2 to 10 mm in size. These polyps       were  removed with a cold snare. Resection and retrieval were complete.      Normal mucosa was found in the entire colon otherwise.      Non-bleeding non-thrombosed external and internal hemorrhoids were found       during retroflexion, during perianal exam and during digital exam. The       hemorrhoids were Grade II (internal hemorrhoids that prolapse but reduce       spontaneously). Impression:               - Hemorrhoids found on digital rectal exam.                           - Four 2 to 10 mm polyps in the sigmoid colon, in                            the transverse colon and in the ascending colon,                            removed with a cold snare. Resected and  retrieved.                           - Normal mucosa in the entire examined colon                            otherwise.                           - Non-bleeding non-thrombosed external and internal                            hemorrhoids. Moderate Sedation:      Not Applicable - Patient had care per Anesthesia. Recommendation:           - The patient will be observed post-procedure,                            until all discharge criteria are met.                           - Discharge patient to home.                           - Patient has a contact number available for                            emergencies. The signs and symptoms of potential                            delayed complications were discussed with the                            patient. Return to normal activities tomorrow.                            Written discharge instructions were provided to the  patient.                           - High fiber diet.                           - Use FiberCon 1-2 tablets PO daily.                           - Continue present medications.                           - Await pathology results.                           - Repeat colonoscopy in 3 years for surveillance.                           - The findings and recommendations were discussed                            with the patient.                           - The findings and recommendations were discussed                            with the patient's family. Procedure Code(s):        --- Professional ---                           [phone], Colonoscopy, flexible; with removal of                            tumor(s), polyp(s), or other lesion(s) by snare                            technique Diagnosis Code(s):        --- Professional ---                           Z86.010, Personal history of colonic polyps                           D12.5, Benign neoplasm of sigmoid colon                           D12.3, Benign  neoplasm of transverse colon (hepatic                            flexure or splenic flexure)                           D12.2, Benign neoplasm of ascending colon                           K64.1, Second degree hemorrhoids CPT copyright 2022 American Medical Association.  All rights reserved. The codes documented in this report are preliminary and upon coder review may  be revised to meet current compliance requirements. Justice Britain, MD 07/26/2022 9:59:16 AM Number of Addenda: 0

## 2022-07-26 NOTE — Discharge Instructions (Signed)
YOU HAD AN ENDOSCOPIC PROCEDURE TODAY: Refer to the procedure report and other information in the discharge instructions given to you for any specific questions about what was found during the examination. If this information does not answer your questions, please call [place] office at [phone] to clarify.  ° °YOU SHOULD EXPECT: Some feelings of bloating in the abdomen. Passage of more gas than usual. Walking can help get rid of the air that was put into your GI tract during the procedure and reduce the bloating. If you had a lower endoscopy (such as a colonoscopy or flexible sigmoidoscopy) you may notice spotting of blood in your stool or on the toilet paper. Some abdominal soreness may be present for a day or two, also. ° °DIET: Your first meal following the procedure should be a light meal and then it is ok to progress to your normal diet. A half-sandwich or bowl of soup is an example of a good first meal. Heavy or fried foods are harder to digest and may make you feel nauseous or bloated. Drink plenty of fluids but you should avoid alcoholic beverages for 24 hours. If you had a esophageal dilation, please see attached instructions for diet.   ° °ACTIVITY: Your care partner should take you home directly after the procedure. You should plan to take it easy, moving slowly for the rest of the day. You can resume normal activity the day after the procedure however YOU SHOULD NOT DRIVE, use power tools, machinery or perform tasks that involve climbing or major physical exertion for 24 hours (because of the sedation medicines used during the test).  ° °SYMPTOMS TO REPORT IMMEDIATELY: °A gastroenterologist can be reached at any hour. Please call [phone]  for any of the following symptoms:  °Following lower endoscopy (colonoscopy, flexible sigmoidoscopy) °Excessive amounts of blood in the stool  °Significant tenderness, worsening of abdominal pains  °Swelling of the abdomen that is new, acute  °Fever of 100° or  higher  °Following upper endoscopy (EGD, EUS, ERCP, esophageal dilation) °Vomiting of blood or coffee ground material  °New, significant abdominal pain  °New, significant chest pain or pain under the shoulder blades  °Painful or persistently difficult swallowing  °New shortness of breath  °Black, tarry-looking or red, bloody stools ° °FOLLOW UP:  °If any biopsies were taken you will be contacted by phone or by letter within the next 1-3 weeks. Call [phone]  if you have not heard about the biopsies in 3 weeks.  °Please also call with any specific questions about appointments or follow up tests. ° °

## 2022-07-26 NOTE — H&P (Signed)
GASTROENTEROLOGY PROCEDURE H&P NOTE   Primary Care Physician: [facility]  HPI: Harold Davenport is a 62 y.o. male who presents for EGD-EUS/Colonoscopy for history of gastric SEL (previous noted but not on last 2021 EGD) and for colon polyp surveillance.  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)    CAD (coronary artery disease)    Cannabis dependence (HCC)    Carpal tunnel syndrome    Chronic kidney disease, stage 3, mod decreased GFR (HCC)    Cocaine dependence (HCC)    Colon polyps    Complication of anesthesia    per pt, had uncontrollable movement during sedation./last procedure imcomplete   Dementia ([place])    Depression    Diabetes ([place])    Gastric lesion    submucosal gastric lesion/gastric nodule   GERD (gastroesophageal reflux disease)    Heart attack ([place])    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   HIV (human immunodeficiency virus infection) ([place])    Hypertension    Peripheral neuropathy    Renal failure    Seizure ([place])    last   Sleep apnea    have a cpap but use it when he is "Heavy"   Spinal headache    Wears glasses    Past Surgical History:  Procedure Laterality Date   [place], [place] at the [place]. First one done at 66. Recently had one done (2019 or 2020) and wasn't completed   CORONARY STENT PLACEMENT     ESOPHAGOGASTRODUODENOSCOPY     [place], [place] at the [place]. Thinks it was done 2020 it was incomplete   ESOPHAGOGASTRODUODENOSCOPY (EGD) WITH PROPOFOL N/A 05/14/2019   Procedure: ESOPHAGOGASTRODUODENOSCOPY (EGD) WITH PROPOFOL;  Surgeon: Rush Landmark Telford Nab., MD;  Location: [place];  Service: Gastroenterology;  Laterality: N/A;   EUS N/A 05/14/2019   Procedure: UPPER ENDOSCOPIC ULTRASOUND (EUS)  RADIAL;  Surgeon: Irving Copas., MD;  Location: [place];  Service: Gastroenterology;  Laterality: N/A;   Current Facility-Administered Medications  Medication Dose Route Frequency Provider Last Rate Last Admin   0.9 %  sodium chloride infusion   Intravenous Continuous Mansouraty, Telford Nab., MD       lactated ringers infusion   Intravenous Continuous Mansouraty, Telford Nab., MD        Current Facility-Administered Medications:    0.9 %  sodium chloride infusion, , Intravenous, Continuous, Mansouraty, Telford Nab., MD   lactated ringers infusion, , Intravenous, Continuous, Mansouraty, Telford Nab., MD Allergies  Allergen Reactions   Crixivan [Indinavir]     Swelling   Lisinopril Swelling and Anaphylaxis    Tongue swelling   Scallops [Shellfish Allergy]     Swelling   Sustiva [Efavirenz]     Water Retention   Ampicillin     Rash Did it involve swelling of the face/tongue/throat, SOB, or low BP? No Did it involve sudden or severe rash/hives, skin peeling, or any reaction on the inside of your mouth or nose? Yes Did you need to seek medical attention at a hospital or doctor's office? Yes When did it last happen?      20 years If all above answers  are "NO", may proceed with cephalosporin use.    Compazine [Prochlorperazine Edisylate]     Twisted Face   Family History  Problem Relation Age of Onset   Diabetes Mother    Diabetes Father    Diabetes Sister        has 4 sisters   Diabetes Brother    Diabetes Brother    Diabetes Brother    Colon cancer Neg Hx    Esophageal cancer Neg Hx    Rectal cancer Neg Hx    Stomach cancer Neg Hx    Social History   Socioeconomic History   Marital status: Single    Spouse name: Not on file   Number of children: 2   Years of education: Not on file   Highest education level: Not on file  Occupational History   Occupation: Retired Psychologist, clinical   Occupation: retired  Tobacco Use   Smoking status: Some Days   Smokeless tobacco:  Never   Tobacco comments:    smokes a pack a week  Vaping Use   Vaping Use: Never used  Substance and Sexual Activity   Alcohol use: Yes    Comment: rarely   Drug use: Yes    Types: Marijuana    Comment:  4 times a week   Sexual activity: Not Currently  Other Topics Concern   Not on file  Social History Narrative   Not on file   Social Determinants of Health   Financial Resource Strain: Not on file  Food Insecurity: Not on file  Transportation Needs: Not on file  Physical Activity: Not on file  Stress: Not on file  Social Connections: Not on file  Intimate Partner Violence: Not on file    Physical Exam: There were no vitals filed for this visit. There is no height or weight on file to calculate BMI. GEN: NAD EYE: Sclerae anicteric ENT: MMM CV: Non-tachycardic GI: Soft, NT/ND NEURO:  Alert & Oriented x 3  Lab Results: No results for input(s): "WBC", "HGB", "HCT", "PLT" in the last 72 hours. BMET No results for input(s): "NA", "K", "CL", "CO2", "GLUCOSE", "BUN", "CREATININE", "CALCIUM" in the last 72 hours. LFT No results for input(s): "PROT", "ALBUMIN", "AST", "ALT", "ALKPHOS", "BILITOT", "BILIDIR", "IBILI" in the last 72 hours. PT/INR No results for input(s): "LABPROT", "INR" in the last 72 hours.   Impression / Plan: This is a 62 y.o.male who presents for EGD-EUS/Colonoscopy for history of gastric SEL (previous noted but not on last 2021 EGD) and for colon polyp surveillance.  The risks and benefits of endoscopic evaluation/treatment were discussed with the patient and/or family; these include but are not limited to the risk of perforation, infection, bleeding, missed lesions, lack of diagnosis, severe illness requiring hospitalization, as well as anesthesia and sedation related illnesses.  The patient's history has been reviewed, patient examined, no change in status, and deemed stable for procedure.  The patient and/or family is agreeable to proceed.     Justice Britain, MD [facility] Office # [phone]

## 2022-07-26 NOTE — Op Note (Signed)
[facility] Patient Name: Harold Davenport Procedure Date: 07/26/2022 MRN: [id number] Attending MD: Justice Britain , MD, [id number] Date of Birth: 20-Sep-1960 CSN: [id number] Age: 62 Admit Type: Outpatient Procedure:                Upper EUS Indications:              Gastric deformity on endoscopy/Subepithelial tumor                            versus extrinsic compression, Submucosal tumor                            versus extrinsic mass found on endoscopy Providers:                Justice Britain, MD, Jeanella Cara, RN,                            Cletis Athens, Technician Referring MD:             Willia Craze, [facility] Medicines:                Monitored Anesthesia Care Complications:            No immediate complications. Estimated Blood Loss:     Estimated blood loss was minimal. Procedure:                Pre-Anesthesia Assessment:                           - Prior to the procedure, a History and Physical                            was performed, and patient medications and                            allergies were reviewed. The patient's tolerance of                            previous anesthesia was also reviewed. The risks                            and benefits of the procedure and the sedation                            options and risks were discussed with the patient.                            All questions were answered, and informed consent                            was obtained. Prior Anticoagulants: The patient has                            taken no anticoagulant or antiplatelet agents  except for aspirin. ASA Grade Assessment: III - A                            patient with severe systemic disease. After                            reviewing the risks and benefits, the patient was                            deemed in satisfactory condition to undergo the                            procedure.                            After obtaining informed consent, the endoscope was                            passed under direct vision. Throughout the                            procedure, the patient's blood pressure, pulse, and                            oxygen saturations were monitored continuously. The                            [id number] Olympus endoscope was introduced                            through the mouth, and advanced to the second part                            of duodenum. The [id number] Olympus                            radial ultrasound scope was introduced through the                            mouth, and advanced to the stomach for ultrasound                            examination. The upper EUS was accomplished without                            difficulty. The patient tolerated the procedure. Scope In: Scope Out: Findings:      ENDOSCOPIC FINDING: :      No gross lesions were noted in the proximal esophagus and in the mid       esophagus.      A single island of salmon-colored mucosa were present at 41 cm in the       distal esophagus. No other visible abnormalities were present. Biopsies       were taken with a cold forceps for histology to rule in/out Barrett's.  LA Grade A (one or more mucosal breaks less than 5 mm, not extending       between tops of 2 mucosal folds) esophagitis with no bleeding was found       in the distal esophagus.      The Z-line was irregular and was found 42 cm from the incisors.      A single 7 mm subepithelial papule (nodule) with no bleeding and no       stigmata of recent bleeding was found in the gastric antrum.      Patchy mildly erythematous mucosa without bleeding was found in the       entire examined stomach. Biopsies were taken with a cold forceps for       histology and Helicobacter pylori testing.      Localized moderately erythematous mucosa without active bleeding and       with no stigmata of bleeding was  found in the duodenal bulb. Biopsies       were taken with a cold forceps for histology.      A mild angulation deformity was found in the D1-D2 sweep.      No gross lesions were noted in the second portion of the duodenum.      ENDOSONOGRAPHIC FINDING: :      A rounded intramural (subepithelial) lesion was found in the antrum of       the stomach. The lesion was hypoechoic. Sonographically, the lesion       appeared to originate from the muscularis propria (Layer 4). The lesion       measured 7 mm (in maximum thickness). The lesion also measured 6 mm in       diameter. The outer endosonographic borders were smooth. Due to the size       of this subepithelial lesion and the layer 4 muscularis propria being       involved, this was not attempted for sampling or removal.      Endosonographic imaging in the cardia of the stomach, in the body of the       stomach and in the pylorus showed no intramural (subepithelial) lesion.      No malignant-appearing lymph nodes were visualized in the left gastric       region (level 17), gastrohepatic ligament (level 18) and celiac region       (level 20).      The celiac region was visualized. Impression:               EGD impression:                           - No gross lesions in the proximal esophagus and in                            the mid esophagus.                           - Salmon-colored mucosal island suggestive of                            Barrett's esophagus found distally. Biopsied.                           - Grade A esophagitis noted distally.                           -  Z-line irregular, 42 cm from the incisors.                           - A single subepithelial papule (nodule) found in                            the antrum of the stomach.                           - Erythematous mucosa in the stomach. Biopsied.                           - Erythematous duodenopathy in the bulb. Biopsied.                           - Duodenal angulation  deformity in D1-D2 sweep.                           - No gross lesions in the second portion of the                            duodenum.                           EUS impression:                           - An intramural (subepithelial) lesion was found in                            the antrum of the stomach. The lesion appeared to                            originate from within the muscularis propria (Layer                            4). Tissue has not been obtained. 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                            Due to the size this will need monitoring                            periodically.                           - No malignant-appearing lymph nodes were                            visualized in the left gastric  region (level 17),                            gastrohepatic ligament (level 18) and celiac region                            (level 20). Moderate Sedation:      Not Applicable - Patient had care per Anesthesia. Recommendation:           - Proceed to scheduled colonoscopy.                           - Await path results.                           - Increase to 40 mg Omeprazole twice daily for                            77-months and if no issues then may go back to 20 mg                            twice daily thereafter, this will hopefully heal                            the esophagitis.                           - Repeat the upper endoscopic ultrasound in 2 years                            for surveillance.                           - The findings and recommendations were discussed                            with the patient.                           - The findings and recommendations were discussed                            with the patient's family. Procedure Code(s):        --- Professional ---                           [id number],  Esophagogastroduodenoscopy, flexible,                            transoral; with endoscopic ultrasound examination                            limited to the esophagus, stomach or duodenum, and                            adjacent structures  [id number], Esophagogastroduodenoscopy, flexible,                            transoral; with biopsy, single or multiple Diagnosis Code(s):        --- Professional ---                           K22.89, Other specified disease of esophagus                           K31.89, Other diseases of stomach and duodenum                           I89.9, Noninfective disorder of lymphatic vessels                            and lymph nodes, unspecified                           K92.9, Disease of digestive system, unspecified CPT copyright 2022 American [employer] Association. All rights reserved. The codes documented in this report are preliminary and upon coder review may  be revised to meet current compliance requirements. Justice Britain, MD 07/26/2022 9:55:10 AM Number of Addenda: 0

## 2022-07-26 NOTE — Anesthesia Postprocedure Evaluation (Signed)
Anesthesia Post Note  Patient: Harold Davenport  Procedure(s) Performed: COLONOSCOPY WITH PROPOFOL UPPER ENDOSCOPIC ULTRASOUND (EUS) RADIAL BIOPSY POLYPECTOMY     Patient location during evaluation: PACU Anesthesia Type: MAC Level of consciousness: awake and alert Pain management: pain level controlled Vital Signs Assessment: post-procedure vital signs reviewed and stable Respiratory status: spontaneous breathing, nonlabored ventilation, respiratory function stable and patient connected to nasal cannula oxygen Cardiovascular status: stable and blood pressure returned to baseline Postop Assessment: no apparent nausea or vomiting Anesthetic complications: no   No notable events documented.  Last Vitals:  Vitals:   07/26/22 0954 07/26/22 0958  BP: 119/78 136/83  Pulse:  (!) 53  Resp:  13  Temp:    SpO2:  97%    Last Pain:  Vitals:   07/26/22 0958  TempSrc:   PainSc: 0-No pain                 March Rummage Siana Panameno

## 2022-07-27 LAB — SURGICAL PATHOLOGY

## 2022-07-30 ENCOUNTER — Encounter (HOSPITAL_COMMUNITY): Payer: Self-pay | Admitting: Gastroenterology

## 2022-08-02 ENCOUNTER — Encounter: Payer: Self-pay | Admitting: Gastroenterology

## 2023-01-01 ENCOUNTER — Encounter: Payer: Self-pay | Admitting: Nurse Practitioner

## 2024-05-08 ENCOUNTER — Emergency Department (HOSPITAL_COMMUNITY)
Admission: EM | Admit: 2024-05-08 | Discharge: 2024-05-09 | Disposition: A | Attending: Emergency Medicine | Admitting: Emergency Medicine

## 2024-05-08 ENCOUNTER — Other Ambulatory Visit: Payer: Self-pay

## 2024-05-08 DIAGNOSIS — K2100 Gastro-esophageal reflux disease with esophagitis, without bleeding: Secondary | ICD-10-CM | POA: Insufficient documentation

## 2024-05-08 DIAGNOSIS — I129 Hypertensive chronic kidney disease with stage 1 through stage 4 chronic kidney disease, or unspecified chronic kidney disease: Secondary | ICD-10-CM | POA: Insufficient documentation

## 2024-05-08 DIAGNOSIS — K5909 Other constipation: Secondary | ICD-10-CM | POA: Diagnosis not present

## 2024-05-08 DIAGNOSIS — N183 Chronic kidney disease, stage 3 unspecified: Secondary | ICD-10-CM | POA: Insufficient documentation

## 2024-05-08 DIAGNOSIS — Z21 Asymptomatic human immunodeficiency virus [HIV] infection status: Secondary | ICD-10-CM | POA: Diagnosis not present

## 2024-05-08 DIAGNOSIS — R109 Unspecified abdominal pain: Secondary | ICD-10-CM | POA: Diagnosis present

## 2024-05-08 DIAGNOSIS — Q5522 Retractile testis: Secondary | ICD-10-CM | POA: Diagnosis not present

## 2024-05-08 DIAGNOSIS — K449 Diaphragmatic hernia without obstruction or gangrene: Secondary | ICD-10-CM | POA: Diagnosis not present

## 2024-05-08 DIAGNOSIS — I2510 Atherosclerotic heart disease of native coronary artery without angina pectoris: Secondary | ICD-10-CM | POA: Insufficient documentation

## 2024-05-08 DIAGNOSIS — Z7982 Long term (current) use of aspirin: Secondary | ICD-10-CM | POA: Insufficient documentation

## 2024-05-08 LAB — COMPREHENSIVE METABOLIC PANEL WITH GFR
ALT: 14 U/L (ref 0–44)
AST: 21 U/L (ref 15–41)
Albumin: 4 g/dL (ref 3.5–5.0)
Alkaline Phosphatase: 76 U/L (ref 38–126)
Anion gap: 11 (ref 5–15)
BUN: 28 mg/dL — ABNORMAL HIGH (ref 8–23)
CO2: 22 mmol/L (ref 22–32)
Calcium: 9.2 mg/dL (ref 8.9–10.3)
Chloride: 108 mmol/L (ref 98–111)
Creatinine, Ser: 2.16 mg/dL — ABNORMAL HIGH (ref 0.61–1.24)
GFR, Estimated: 34 mL/min — ABNORMAL LOW
Glucose, Bld: 128 mg/dL — ABNORMAL HIGH (ref 70–99)
Potassium: 4.1 mmol/L (ref 3.5–5.1)
Sodium: 141 mmol/L (ref 135–145)
Total Bilirubin: 0.2 mg/dL (ref 0.0–1.2)
Total Protein: 7.1 g/dL (ref 6.5–8.1)

## 2024-05-08 LAB — URINALYSIS, ROUTINE W REFLEX MICROSCOPIC
Bacteria, UA: NONE SEEN
Bilirubin Urine: NEGATIVE
Glucose, UA: >=500 mg/dL — AB
Hgb urine dipstick: NEGATIVE
Ketones, ur: NEGATIVE mg/dL
Leukocytes,Ua: NEGATIVE
Nitrite: NEGATIVE
Protein, ur: NEGATIVE mg/dL
Specific Gravity, Urine: 1.027 (ref 1.005–1.030)
pH: 5 (ref 5.0–8.0)

## 2024-05-08 LAB — CBC
HCT: 38.8 % — ABNORMAL LOW (ref 39.0–52.0)
Hemoglobin: 13 g/dL (ref 13.0–17.0)
MCH: 32.7 pg (ref 26.0–34.0)
MCHC: 33.5 g/dL (ref 30.0–36.0)
MCV: 97.5 fL (ref 80.0–100.0)
Platelets: 228 K/uL (ref 150–400)
RBC: 3.98 MIL/uL — ABNORMAL LOW (ref 4.22–5.81)
RDW: 13.8 % (ref 11.5–15.5)
WBC: 6.4 K/uL (ref 4.0–10.5)
nRBC: 0 % (ref 0.0–0.2)

## 2024-05-08 LAB — LIPASE, BLOOD: Lipase: 72 U/L — ABNORMAL HIGH (ref 11–51)

## 2024-05-08 NOTE — ED Triage Notes (Signed)
 Pt reports was  at the [place], sent him here for further work up for a SBO. Pt reports abd pain, dry heaves x Tuesday , denies diarrhea

## 2024-05-09 ENCOUNTER — Emergency Department (HOSPITAL_COMMUNITY)

## 2024-05-09 NOTE — ED Provider Notes (Signed)
 " [place] EMERGENCY DEPARTMENT AT [facility] Provider Note  CSN: [id number] Arrival date & time: 05/08/24 1530  Chief Complaint(s) Abdominal Pain  History provided by patient. HPI & MDM Harold Davenport is a 64 y.o. male with a past medical history listed below who presents for:.   Constipation  Patient reports that he has been having difficulty with bowel movements stating that he has very small bowel movements every morning.  This been ongoing for 1 week.  Patient also endorsing and feeling discomfort in his distal esophagus while eating.  He endorses nausea and dry heaving without emesis.  Patient reports being seen at the [facility] having x-ray questioning bowel obstruction.  He was instructed to present to the emergency department for evaluation of small bowel obstruction.  Medical Decision Making Amount and/or Complexity of Data Reviewed Labs: ordered. Decision-making details documented in ED Course. Radiology: ordered and independent interpretation performed. Decision-making details documented in ED Course.  Risk Decision regarding hospitalization.    Differential diagnosis considered.  Workup below. CBC without leukocytosis or anemia.  Metabolic panel without significant electrolyte derangements.  Hyperglycemia without DKA.  Renal insufficiency with baseline renal function.  No bili obstruction.  Mildly elevated lipase but not overtly concerning for pancreatitis.  UA without evidence of infection. CT scan negative for any acute intra-abdominal inflammatory/infectious or bowel obstruction.  Patient does have questionable small hiatal hernia with evidence of distal esophagitis.  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  This was confirmed on exam.  Patient made aware of finding and instructed to follow-up with urology through the [facility].  Final Clinical Impression(s) / ED Diagnoses Final diagnoses:  Retractile testis  Other constipation  Hiatal hernia  Gastroesophageal  reflux disease with esophagitis without hemorrhage   The patient appears reasonably screened and/or stabilized for discharge and I doubt any other medical condition or other [facility] requiring further screening, evaluation, or treatment in the ED at this time. I have discussed the findings, Dx and Tx plan with the patient/family who expressed understanding and agree(s) with the plan. Discharge instructions discussed at length. The patient/family was given strict return precautions who verbalized understanding of the instructions. No further questions at time of discharge.  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    CAD (coronary artery disease)    Cannabis dependence (HCC)    Carpal tunnel syndrome    Chronic kidney disease, stage 3, mod decreased GFR (HCC)    Cocaine dependence (HCC)    Colon polyps    Complication of anesthesia    per pt, had uncontrollable movement during sedation./last procedure imcomplete   Dementia (HCC)    Depression    Diabetes (HCC)    Gastric lesion    submucosal gastric lesion/gastric nodule   GERD (gastroesophageal reflux disease)    Heart attack (HCC)    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   HIV (human immunodeficiency virus infection) (  HCC)    Hypertension    Peripheral neuropathy    Renal failure    Seizure (HCC)    last   Sleep apnea    have a cpap but use it when he is Heavy   Spinal headache    Wears glasses    Patient Active Problem List   Diagnosis Date Noted   Lymphocytosis  04/30/2019   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 Authorizing Provider  amLODipine (NORVASC) 10 MG tablet Take 10 mg by mouth in the morning.    [provider]  aspirin EC 81 MG tablet Take 81 mg by mouth in the morning.    [provider]  bictegravir-emtricitabine-tenofovir AF (BIKTARVY) 50-200-25 MG TABS tablet Take 1 tablet by mouth daily.    [provider]  carvedilol  (COREG ) 25 MG tablet Take 25 mg by mouth in the morning and at bedtime.    [provider]  Cholecalciferol 125 MCG (5000 UT) capsule Take 5,000 Units by mouth in the morning. 12/22/18   [provider]  empagliflozin (JARDIANCE) 25 MG TABS tablet Take 25 mg by mouth in the morning.    [provider]  eplerenone (INSPRA) 25 MG tablet Take 25 mg by mouth in the morning.    [provider]  FLUoxetine (PROZAC) 10 MG capsule Take 50 mg by mouth in the morning.    [provider]  fluticasone (FLONASE) 50 MCG/ACT nasal spray Place 1 spray into both nostrils 2 (two) times daily as needed for allergies.  01/05/20   [provider]  isosorbide mononitrate (IMDUR) 60 MG 24 hr tablet Take 60 mg by mouth every evening.    [provider]  LANTUS SOLOSTAR 100 UNIT/ML Solostar Pen Inject 24 Units into the skin at bedtime. 10/30/18   [provider]  omeprazole  (PRILOSEC) 20 MG capsule Take 1 capsule (20 mg total) by mouth 2 (two) times daily. Patient taking differently:  Take 20 mg by mouth 2 (two) times daily as needed (indigestion). 05/14/19   Mansouraty, Gabriel Jr., MD  pregabalin (LYRICA) 75 MG capsule Take 75 mg by mouth 3 (three) times daily. 01/05/20   [provider]  REFRESH 1.4-0.6 % SOLN Place 1 drop into both eyes 3 (three) times daily as needed (dry/irritated eyes.). 07/11/18   [provider]  tamsulosin (FLOMAX) 0.4 MG CAPS capsule Take 0.4 mg by mouth at bedtime.    [provider]  traMADol (ULTRAM) 50 MG tablet Take 50 mg by mouth 2 (two) times daily as needed (pain.). 02/15/22   [provider]  traZODone (DESYREL) 100 MG tablet Take 200 mg by mouth at bedtime.    [provider]  Allergies Crixivan [indinavir], Lisinopril, Scallops [shellfish allergy], Sustiva [efavirenz], Ampicillin, and Compazine [prochlorperazine edisylate]  Review of Systems Review of Systems  Gastrointestinal:  Positive for constipation.   As noted in HPI  Physical Exam Vital Signs  I have reviewed the triage vital signs BP (!) 174/103 (BP Location: Left Arm)   Pulse 65   Temp 97.9 F (36.6 C) (Oral)   Resp 18   SpO2 97%   Physical Exam Vitals reviewed.  Constitutional:      General: He is not in acute distress.    Appearance: He is well-developed. He is not diaphoretic.  HENT:     Head: Normocephalic and atraumatic.     Right Ear: External ear normal.     Left Ear: External ear normal.     Nose: Nose normal.     Mouth/Throat:     Mouth: Mucous membranes are moist.  Eyes:     General: No scleral icterus.    Conjunctiva/sclera: Conjunctivae normal.  Neck:     Trachea: Phonation normal.  Cardiovascular:     Rate and Rhythm: Normal rate and regular rhythm.  Pulmonary:     Effort: Pulmonary effort is normal. No respiratory distress.     Breath sounds: No stridor.  Abdominal:      General: There is no distension.     Tenderness: There is no abdominal tenderness.  Genitourinary:    Testes:        Right: Tenderness or swelling not present. Undescended: retractile testicle.        Left: Tenderness or swelling not present.  Musculoskeletal:        General: Normal range of motion.     Cervical back: Normal range of motion.  Neurological:     Mental Status: He is alert and oriented to person, place, and time.  Psychiatric:        Behavior: Behavior normal.     ED Results and Treatments Labs (all labs ordered are listed, but only abnormal results are displayed) Labs Reviewed  LIPASE, BLOOD - Abnormal; Notable for the following components:      Result Value   Lipase 72 (*)    All other components within normal limits  COMPREHENSIVE METABOLIC PANEL WITH GFR - Abnormal; Notable for the following components:   Glucose, Bld 128 (*)    BUN 28 (*)    Creatinine, Ser 2.16 (*)    GFR, Estimated 34 (*)    All other components within normal limits  CBC - Abnormal; Notable for the following components:   RBC 3.98 (*)    HCT 38.8 (*)    All other components within normal limits  URINALYSIS, ROUTINE W REFLEX MICROSCOPIC - Abnormal; Notable for the following components:   Glucose, UA >=500 (*)    All other components within normal limits  EKG  EKG Interpretation Date/Time:    Ventricular Rate:    PR Interval:    QRS Duration:    QT Interval:    QTC Calculation:   R Axis:      Text Interpretation:         Radiology CT ABDOMEN PELVIS WO CONTRAST Result Date: 05/09/2024 EXAM: CT ABDOMEN AND PELVIS WITHOUT CONTRAST 05/09/2024 01:06:18 AM TECHNIQUE: CT of the abdomen and pelvis was performed without the administration of intravenous contrast. Multiplanar reformatted images are provided for review. Automated exposure control, iterative  reconstruction, and/or weight-based adjustment of the mA/kV was utilized to reduce the radiation dose to as low as reasonably achievable. COMPARISON: None available. CLINICAL HISTORY: Bowel obstruction suspected. FINDINGS: LOWER CHEST: Questionable small hiatal hernia versus distal esophageal wall thickening. LIVER: The liver is unremarkable. GALLBLADDER AND BILE DUCTS: Gallbladder is unremarkable. No biliary ductal dilatation. SPLEEN: No acute abnormality. PANCREAS: No acute abnormality. ADRENAL GLANDS: No acute abnormality. KIDNEYS, URETERS AND BLADDER: No stones in the kidneys or ureters. No hydronephrosis. No perinephric or periureteral stranding. Urinary bladder is unremarkable. GI AND BOWEL: Stomach demonstrates no acute abnormality. No small or large bowel thickening or dilatation. The appendix is unremarkable. There is no bowel obstruction. PERITONEUM AND RETROPERITONEUM: No ascites. No free air. VASCULATURE: Aorta is normal in caliber. Moderate-to-severe atherosclerotic plaque. LYMPH NODES: No lymphadenopathy. REPRODUCTIVE ORGANS: Prostate is unremarkable. Likely retractile with right testis within the inguinal canal. BONES AND SOFT TISSUES: No acute osseous abnormality. No focal soft tissue abnormality. IMPRESSION: 1. No evidence of bowel obstruction. 2. Questionable small hiatal hernia versus distal esophageal wall thickening. 3. Likely retractile right testis within the inguinal canal. Correlate with physical exam. 4. Severe atherosclerotic plaque. Electronically signed by: Morgane Naveau MD MD 05/09/2024 01:10 AM EST RP Workstation: [id number]    Medications Ordered in ED Medications - No data to display Procedures Procedures  (including critical care time)   This chart was dictated using voice recognition software.  Despite best efforts to proofread,  errors can occur which can change the documentation meaning.   Trine Raynell Moder, MD 05/09/24 [phone]  "
